# Patient Record
Sex: Female | Born: 1950
Health system: Southern US, Community
[De-identification: ages and names within clinical notes are randomized; demographics above are authoritative.]

## PROBLEM LIST (undated history)

## (undated) DIAGNOSIS — I6529 Occlusion and stenosis of unspecified carotid artery: Secondary | ICD-10-CM

## (undated) DIAGNOSIS — M810 Age-related osteoporosis without current pathological fracture: Secondary | ICD-10-CM

## (undated) DIAGNOSIS — F17200 Nicotine dependence, unspecified, uncomplicated: Secondary | ICD-10-CM

## (undated) DIAGNOSIS — I10 Essential (primary) hypertension: Secondary | ICD-10-CM

## (undated) DIAGNOSIS — R079 Chest pain, unspecified: Secondary | ICD-10-CM

## (undated) DIAGNOSIS — N83209 Unspecified ovarian cyst, unspecified side: Secondary | ICD-10-CM

## (undated) DIAGNOSIS — E890 Postprocedural hypothyroidism: Secondary | ICD-10-CM

## (undated) DIAGNOSIS — K769 Liver disease, unspecified: Secondary | ICD-10-CM

## (undated) DIAGNOSIS — E7800 Pure hypercholesterolemia, unspecified: Secondary | ICD-10-CM

## (undated) DIAGNOSIS — Z86010 Personal history of colonic polyps: Secondary | ICD-10-CM

## (undated) DIAGNOSIS — E119 Type 2 diabetes mellitus without complications: Secondary | ICD-10-CM

## (undated) DIAGNOSIS — E0590 Thyrotoxicosis, unspecified without thyrotoxic crisis or storm: Secondary | ICD-10-CM

## (undated) HISTORY — DX: Postprocedural hypothyroidism: E89.0

## (undated) HISTORY — DX: Occlusion and stenosis of unspecified carotid artery: I65.29

## (undated) HISTORY — DX: Essential (primary) hypertension: I10

## (undated) HISTORY — DX: Personal history of colonic polyps: Z86.010

## (undated) HISTORY — DX: Thyrotoxicosis, unspecified without thyrotoxic crisis or storm: E05.90

## (undated) HISTORY — DX: Type 2 diabetes mellitus without complications: E11.9

## (undated) HISTORY — DX: Pure hypercholesterolemia, unspecified: E78.00

## (undated) HISTORY — DX: Age-related osteoporosis without current pathological fracture: M81.0

## (undated) HISTORY — DX: Unspecified ovarian cyst, unspecified side: N83.209

## (undated) HISTORY — DX: Liver disease, unspecified: K76.9

## (undated) HISTORY — DX: Nicotine dependence, unspecified, uncomplicated: F17.200

## (undated) HISTORY — DX: Chest pain, unspecified: R07.9

---

## 1982-07-31 HISTORY — PX: ESOPHAGOGASTRODUODENOSCOPY: SHX1529

## 1987-05-11 HISTORY — PX: OTHER SURGICAL HISTORY: SHX169

## 1999-01-21 ENCOUNTER — Other Ambulatory Visit: Admission: RE | Admit: 1999-01-21 | Discharge: 1999-01-21 | Payer: Self-pay | Admitting: Obstetrics and Gynecology

## 2000-01-26 ENCOUNTER — Other Ambulatory Visit: Admission: RE | Admit: 2000-01-26 | Discharge: 2000-01-26 | Payer: Self-pay | Admitting: Obstetrics and Gynecology

## 2001-12-19 ENCOUNTER — Other Ambulatory Visit: Admission: RE | Admit: 2001-12-19 | Discharge: 2001-12-19 | Payer: Self-pay | Admitting: Obstetrics and Gynecology

## 2001-12-22 ENCOUNTER — Ambulatory Visit (HOSPITAL_COMMUNITY): Admission: RE | Admit: 2001-12-22 | Discharge: 2001-12-22 | Payer: Self-pay | Admitting: Obstetrics and Gynecology

## 2001-12-22 ENCOUNTER — Encounter: Payer: Self-pay | Admitting: Obstetrics and Gynecology

## 2002-01-16 ENCOUNTER — Encounter: Payer: Self-pay | Admitting: Endocrinology

## 2002-01-16 ENCOUNTER — Encounter: Admission: RE | Admit: 2002-01-16 | Discharge: 2002-01-16 | Payer: Self-pay | Admitting: Endocrinology

## 2002-05-10 DIAGNOSIS — E0590 Thyrotoxicosis, unspecified without thyrotoxic crisis or storm: Secondary | ICD-10-CM

## 2002-05-10 HISTORY — DX: Thyrotoxicosis, unspecified without thyrotoxic crisis or storm: E05.90

## 2002-06-26 HISTORY — PX: OTHER SURGICAL HISTORY: SHX169

## 2003-01-08 ENCOUNTER — Other Ambulatory Visit: Admission: RE | Admit: 2003-01-08 | Discharge: 2003-01-08 | Payer: Self-pay | Admitting: Obstetrics and Gynecology

## 2003-03-04 ENCOUNTER — Encounter (INDEPENDENT_AMBULATORY_CARE_PROVIDER_SITE_OTHER): Payer: Self-pay | Admitting: *Deleted

## 2003-03-04 ENCOUNTER — Ambulatory Visit (HOSPITAL_COMMUNITY): Admission: RE | Admit: 2003-03-04 | Discharge: 2003-03-04 | Payer: Self-pay | Admitting: Gastroenterology

## 2003-12-04 HISTORY — PX: OTHER SURGICAL HISTORY: SHX169

## 2004-01-08 ENCOUNTER — Other Ambulatory Visit: Admission: RE | Admit: 2004-01-08 | Discharge: 2004-01-08 | Payer: Self-pay | Admitting: Obstetrics and Gynecology

## 2004-04-10 ENCOUNTER — Ambulatory Visit: Payer: Self-pay | Admitting: Endocrinology

## 2005-01-15 ENCOUNTER — Ambulatory Visit: Payer: Self-pay | Admitting: Endocrinology

## 2005-01-21 ENCOUNTER — Ambulatory Visit: Payer: Self-pay | Admitting: Endocrinology

## 2005-03-03 ENCOUNTER — Other Ambulatory Visit: Admission: RE | Admit: 2005-03-03 | Discharge: 2005-03-03 | Payer: Self-pay | Admitting: Obstetrics and Gynecology

## 2005-07-12 ENCOUNTER — Ambulatory Visit: Payer: Self-pay | Admitting: Endocrinology

## 2006-02-24 ENCOUNTER — Ambulatory Visit: Payer: Self-pay | Admitting: Endocrinology

## 2006-02-24 LAB — CONVERTED CEMR LAB
ALT: 32 units/L (ref 0–40)
Albumin: 3.9 g/dL (ref 3.5–5.2)
Basophils Absolute: 0.1 10*3/uL (ref 0.0–0.1)
Chloride: 106 meq/L (ref 96–112)
Cholesterol: 111 mg/dL (ref 0–200)
Creatinine, Ser: 1 mg/dL (ref 0.4–1.2)
GFR calc non Af Amer: 62 mL/min
Glomerular Filtration Rate, Af Am: 75 mL/min/{1.73_m2}
Glucose, Bld: 139 mg/dL — ABNORMAL HIGH (ref 70–99)
HDL: 49.5 mg/dL (ref >39.0)
Hemoglobin, Urine: NEGATIVE
Leukocytes, UA: NEGATIVE
MCHC: 32.9 g/dL (ref 30.0–36.0)
MCV: 90.9 fL (ref 78.0–100.0)
Microalb Creat Ratio: 5 mg/g (ref 0.0–30.0)
Microalb, Ur: 1 mg/dL (ref 0.0–1.9)
Monocytes Absolute: 0.3 10*3/uL (ref 0.2–0.7)
Neutro Abs: 2.5 10*3/uL (ref 1.4–7.7)
Platelets: 193 10*3/uL (ref 150–400)
RBC: 5.24 M/uL — ABNORMAL HIGH (ref 3.87–5.11)
Sodium: 143 meq/L (ref 135–145)
Total Bilirubin: 0.8 mg/dL (ref 0.3–1.2)
Triglyceride fasting, serum: 85 mg/dL (ref 0–149)
Urobilinogen, UA: 0.2 (ref 0.0–1.0)
VLDL: 17 mg/dL (ref 0–40)
WBC: 4.3 10*3/uL — ABNORMAL LOW (ref 4.5–10.5)
pH: 6 (ref 5.0–8.0)

## 2006-03-02 ENCOUNTER — Ambulatory Visit: Payer: Self-pay | Admitting: Endocrinology

## 2006-03-07 ENCOUNTER — Other Ambulatory Visit: Admission: RE | Admit: 2006-03-07 | Discharge: 2006-03-07 | Payer: Self-pay | Admitting: Obstetrics and Gynecology

## 2006-09-01 ENCOUNTER — Ambulatory Visit: Payer: Self-pay | Admitting: Endocrinology

## 2006-09-01 LAB — CONVERTED CEMR LAB
Basophils Absolute: 0 10*3/uL (ref 0.0–0.1)
Hgb A1c MFr Bld: 7 % — ABNORMAL HIGH (ref 4.6–6.0)
MCHC: 33.2 g/dL (ref 30.0–36.0)
Monocytes Absolute: 0.4 10*3/uL (ref 0.2–0.7)
Monocytes Relative: 7.9 % (ref 3.0–11.0)
Platelets: 197 10*3/uL (ref 150–400)
RBC: 5.47 M/uL — ABNORMAL HIGH (ref 3.87–5.11)
RDW: 12.7 % (ref 11.5–14.6)

## 2006-09-06 HISTORY — PX: OTHER SURGICAL HISTORY: SHX169

## 2006-09-07 ENCOUNTER — Ambulatory Visit: Payer: Self-pay

## 2006-10-10 ENCOUNTER — Ambulatory Visit: Payer: Self-pay | Admitting: Internal Medicine

## 2006-12-24 ENCOUNTER — Encounter: Payer: Self-pay | Admitting: Endocrinology

## 2006-12-24 DIAGNOSIS — M810 Age-related osteoporosis without current pathological fracture: Secondary | ICD-10-CM

## 2006-12-24 DIAGNOSIS — E119 Type 2 diabetes mellitus without complications: Secondary | ICD-10-CM

## 2006-12-24 DIAGNOSIS — Z860100 Personal history of colon polyps, unspecified: Secondary | ICD-10-CM

## 2006-12-24 HISTORY — DX: Age-related osteoporosis without current pathological fracture: M81.0

## 2006-12-24 HISTORY — DX: Personal history of colonic polyps: Z86.010

## 2006-12-24 HISTORY — DX: Personal history of colon polyps, unspecified: Z86.0100

## 2006-12-24 HISTORY — DX: Type 2 diabetes mellitus without complications: E11.9

## 2007-03-01 ENCOUNTER — Ambulatory Visit: Payer: Self-pay | Admitting: Endocrinology

## 2007-03-01 LAB — CONVERTED CEMR LAB
BUN: 10 mg/dL (ref 6–23)
Basophils Relative: 3.4 % — ABNORMAL HIGH (ref 0.0–1.0)
Bilirubin Urine: NEGATIVE
Bilirubin, Direct: 0.2 mg/dL (ref 0.0–0.3)
CO2: 31 meq/L (ref 19–32)
Creatinine,U: 217.6 mg/dL
Eosinophils Relative: 2.5 % (ref 0.0–5.0)
GFR calc Af Amer: 96 mL/min
Glucose, Bld: 151 mg/dL — ABNORMAL HIGH (ref 70–99)
HDL: 48.3 mg/dL (ref >39.0)
Hemoglobin: 15.2 g/dL — ABNORMAL HIGH (ref 12.0–15.0)
Leukocytes, UA: NEGATIVE
Lymphocytes Relative: 38.1 % (ref 12.0–46.0)
Microalb Creat Ratio: 15.2 mg/g (ref 0.0–30.0)
Microalb, Ur: 3.3 mg/dL — ABNORMAL HIGH (ref 0.0–1.9)
Monocytes Absolute: 0.3 10*3/uL (ref 0.2–0.7)
Monocytes Relative: 7 % (ref 3.0–11.0)
Neutro Abs: 2.3 10*3/uL (ref 1.4–7.7)
Neutrophils Relative %: 49 % (ref 43.0–77.0)
Potassium: 3.8 meq/L (ref 3.5–5.1)
Sodium: 141 meq/L (ref 135–145)
Specific Gravity, Urine: 1.025 (ref 1.000–1.03)
Total Protein, Urine: NEGATIVE mg/dL
Total Protein: 6.2 g/dL (ref 6.0–8.3)
Triglycerides: 82 mg/dL (ref 0–149)

## 2007-03-07 ENCOUNTER — Ambulatory Visit: Payer: Self-pay | Admitting: Endocrinology

## 2007-10-05 ENCOUNTER — Encounter: Payer: Self-pay | Admitting: Endocrinology

## 2007-11-09 ENCOUNTER — Telehealth (INDEPENDENT_AMBULATORY_CARE_PROVIDER_SITE_OTHER): Payer: Self-pay | Admitting: *Deleted

## 2008-02-02 ENCOUNTER — Encounter: Payer: Self-pay | Admitting: Endocrinology

## 2008-03-05 ENCOUNTER — Ambulatory Visit: Payer: Self-pay | Admitting: Endocrinology

## 2008-03-06 LAB — CONVERTED CEMR LAB
ALT: 43 units/L — ABNORMAL HIGH (ref 0–35)
AST: 34 units/L (ref 0–37)
Alkaline Phosphatase: 61 units/L (ref 39–117)
Basophils Absolute: 0 10*3/uL (ref 0.0–0.1)
Basophils Relative: 0.6 % (ref 0.0–3.0)
Bilirubin, Direct: 0.3 mg/dL (ref 0.0–0.3)
CO2: 32 meq/L (ref 19–32)
Chloride: 104 meq/L (ref 96–112)
Creatinine, Ser: 0.8 mg/dL (ref 0.4–1.2)
GFR calc non Af Amer: 79 mL/min
Hgb A1c MFr Bld: 6.5 % — ABNORMAL HIGH (ref 4.6–6.0)
LDL Cholesterol: 36 mg/dL (ref 0–99)
Leukocytes, UA: NEGATIVE
Lymphocytes Relative: 26.1 % (ref 12.0–46.0)
MCHC: 34.3 g/dL (ref 30.0–36.0)
Neutrophils Relative %: 67 % (ref 43.0–77.0)
Platelets: 196 10*3/uL (ref 150–400)
Potassium: 4.1 meq/L (ref 3.5–5.1)
RBC: 5.17 M/uL — ABNORMAL HIGH (ref 3.87–5.11)
Specific Gravity, Urine: 1.025 (ref 1.000–1.03)
Total Bilirubin: 0.8 mg/dL (ref 0.3–1.2)
Total CHOL/HDL Ratio: 2
Urobilinogen, UA: 0.2 (ref 0.0–1.0)
WBC: 7.3 10*3/uL (ref 4.5–10.5)

## 2008-03-07 ENCOUNTER — Ambulatory Visit: Payer: Self-pay | Admitting: Endocrinology

## 2008-03-07 DIAGNOSIS — I6529 Occlusion and stenosis of unspecified carotid artery: Secondary | ICD-10-CM | POA: Insufficient documentation

## 2008-03-07 DIAGNOSIS — F17200 Nicotine dependence, unspecified, uncomplicated: Secondary | ICD-10-CM | POA: Insufficient documentation

## 2008-03-07 DIAGNOSIS — N83209 Unspecified ovarian cyst, unspecified side: Secondary | ICD-10-CM | POA: Insufficient documentation

## 2008-03-07 DIAGNOSIS — E890 Postprocedural hypothyroidism: Secondary | ICD-10-CM

## 2008-03-07 HISTORY — DX: Postprocedural hypothyroidism: E89.0

## 2008-03-07 HISTORY — DX: Unspecified ovarian cyst, unspecified side: N83.209

## 2008-03-07 HISTORY — DX: Occlusion and stenosis of unspecified carotid artery: I65.29

## 2008-03-07 HISTORY — DX: Nicotine dependence, unspecified, uncomplicated: F17.200

## 2008-03-19 ENCOUNTER — Encounter: Payer: Self-pay | Admitting: Endocrinology

## 2008-04-01 ENCOUNTER — Ambulatory Visit (HOSPITAL_COMMUNITY): Admission: RE | Admit: 2008-04-01 | Discharge: 2008-04-01 | Payer: Self-pay | Admitting: Gastroenterology

## 2008-04-08 ENCOUNTER — Encounter: Payer: Self-pay | Admitting: Endocrinology

## 2008-06-18 ENCOUNTER — Encounter: Payer: Self-pay | Admitting: Endocrinology

## 2008-07-28 ENCOUNTER — Encounter: Payer: Self-pay | Admitting: Endocrinology

## 2008-09-03 ENCOUNTER — Ambulatory Visit: Payer: Self-pay | Admitting: Endocrinology

## 2008-09-03 DIAGNOSIS — E7800 Pure hypercholesterolemia, unspecified: Secondary | ICD-10-CM | POA: Insufficient documentation

## 2008-09-03 DIAGNOSIS — K7581 Nonalcoholic steatohepatitis (NASH): Secondary | ICD-10-CM | POA: Insufficient documentation

## 2008-09-03 DIAGNOSIS — I10 Essential (primary) hypertension: Secondary | ICD-10-CM | POA: Insufficient documentation

## 2008-09-03 DIAGNOSIS — K769 Liver disease, unspecified: Secondary | ICD-10-CM

## 2008-09-03 HISTORY — DX: Liver disease, unspecified: K76.9

## 2008-09-03 HISTORY — DX: Pure hypercholesterolemia, unspecified: E78.00

## 2008-09-03 HISTORY — DX: Essential (primary) hypertension: I10

## 2008-09-03 LAB — CONVERTED CEMR LAB
ALT: 36 units/L — ABNORMAL HIGH (ref 0–35)
AST: 33 units/L (ref 0–37)
Alkaline Phosphatase: 52 units/L (ref 39–117)
Bilirubin, Direct: 0.2 mg/dL (ref 0.0–0.3)
Hgb A1c MFr Bld: 7 % — ABNORMAL HIGH (ref 4.6–6.5)
Total Bilirubin: 0.9 mg/dL (ref 0.3–1.2)

## 2008-09-10 ENCOUNTER — Encounter: Payer: Self-pay | Admitting: Endocrinology

## 2008-09-10 ENCOUNTER — Ambulatory Visit: Payer: Self-pay

## 2008-09-11 ENCOUNTER — Emergency Department (HOSPITAL_COMMUNITY): Admission: EM | Admit: 2008-09-11 | Discharge: 2008-09-11 | Payer: Self-pay | Admitting: Emergency Medicine

## 2008-10-03 ENCOUNTER — Encounter: Payer: Self-pay | Admitting: Endocrinology

## 2008-11-12 ENCOUNTER — Ambulatory Visit: Payer: Self-pay | Admitting: Family Medicine

## 2008-11-12 ENCOUNTER — Encounter: Payer: Self-pay | Admitting: Endocrinology

## 2009-02-05 ENCOUNTER — Encounter: Payer: Self-pay | Admitting: Endocrinology

## 2009-03-05 ENCOUNTER — Ambulatory Visit: Payer: Self-pay | Admitting: Endocrinology

## 2009-03-05 LAB — CONVERTED CEMR LAB
ALT: 73 units/L — ABNORMAL HIGH (ref 0–35)
AST: 52 units/L — ABNORMAL HIGH (ref 0–37)
Albumin: 3.7 g/dL (ref 3.5–5.2)
Alkaline Phosphatase: 62 units/L (ref 39–117)
BUN: 9 mg/dL (ref 6–23)
Basophils Absolute: 0 10*3/uL (ref 0.0–0.1)
Basophils Relative: 0.2 % (ref 0.0–3.0)
Bilirubin Urine: NEGATIVE
Bilirubin, Direct: 0.2 mg/dL (ref 0.0–0.3)
CO2: 32 meq/L (ref 19–32)
Calcium: 9.2 mg/dL (ref 8.4–10.5)
Chloride: 105 meq/L (ref 96–112)
Cholesterol: 90 mg/dL (ref 0–200)
Creatinine, Ser: 0.9 mg/dL (ref 0.4–1.2)
Creatinine,U: 189.5 mg/dL
Eosinophils Absolute: 0.1 10*3/uL (ref 0.0–0.7)
Eosinophils Relative: 2.4 % (ref 0.0–5.0)
GFR calc non Af Amer: 83 mL/min (ref >60)
Glucose, Bld: 143 mg/dL — ABNORMAL HIGH (ref 70–99)
HCT: 45.4 % (ref 36.0–46.0)
HDL: 42.1 mg/dL (ref >39.00)
Hemoglobin: 15 g/dL (ref 12.0–15.0)
Hgb A1c MFr Bld: 6.9 % — ABNORMAL HIGH (ref 4.6–6.5)
LDL Cholesterol: 28 mg/dL (ref 0–99)
Leukocytes, UA: NEGATIVE
Lymphocytes Relative: 31.6 % (ref 12.0–46.0)
Lymphs Abs: 1.7 10*3/uL (ref 0.7–4.0)
MCHC: 33.1 g/dL (ref 30.0–36.0)
MCV: 92.7 fL (ref 78.0–100.0)
Microalb Creat Ratio: 19 mg/g (ref 0.0–30.0)
Microalb, Ur: 3.6 mg/dL — ABNORMAL HIGH (ref 0.0–1.9)
Monocytes Absolute: 0.4 10*3/uL (ref 0.1–1.0)
Monocytes Relative: 7.5 % (ref 3.0–12.0)
Neutro Abs: 3.2 10*3/uL (ref 1.4–7.7)
Neutrophils Relative %: 58.3 % (ref 43.0–77.0)
Nitrite: NEGATIVE
Platelets: 186 10*3/uL (ref 150.0–400.0)
Potassium: 4.5 meq/L (ref 3.5–5.1)
RBC: 4.9 M/uL (ref 3.87–5.11)
RDW: 13.3 % (ref 11.5–14.6)
Sodium: 144 meq/L (ref 135–145)
Specific Gravity, Urine: 1.025 (ref 1.000–1.030)
TSH: 8.87 microintl units/mL — ABNORMAL HIGH (ref 0.35–5.50)
Total Bilirubin: 0.7 mg/dL (ref 0.3–1.2)
Total CHOL/HDL Ratio: 2
Total Protein, Urine: 30 mg/dL
Total Protein: 6.3 g/dL (ref 6.0–8.3)
Triglycerides: 100 mg/dL (ref 0.0–149.0)
Urine Glucose: NEGATIVE mg/dL
Urobilinogen, UA: 1 (ref 0.0–1.0)
VLDL: 20 mg/dL (ref 0.0–40.0)
WBC: 5.4 10*3/uL (ref 4.5–10.5)
pH: 6 (ref 5.0–8.0)

## 2009-03-10 ENCOUNTER — Ambulatory Visit: Payer: Self-pay | Admitting: Endocrinology

## 2009-03-10 DIAGNOSIS — R079 Chest pain, unspecified: Secondary | ICD-10-CM

## 2009-03-10 DIAGNOSIS — R072 Precordial pain: Secondary | ICD-10-CM | POA: Insufficient documentation

## 2009-03-10 HISTORY — DX: Chest pain, unspecified: R07.9

## 2009-03-18 ENCOUNTER — Telehealth (INDEPENDENT_AMBULATORY_CARE_PROVIDER_SITE_OTHER): Payer: Self-pay | Admitting: *Deleted

## 2009-03-19 ENCOUNTER — Ambulatory Visit: Payer: Self-pay

## 2009-03-19 ENCOUNTER — Ambulatory Visit: Payer: Self-pay | Admitting: Internal Medicine

## 2009-03-19 ENCOUNTER — Encounter (HOSPITAL_COMMUNITY): Admission: RE | Admit: 2009-03-19 | Discharge: 2009-05-08 | Payer: Self-pay | Admitting: Endocrinology

## 2009-03-31 ENCOUNTER — Telehealth (INDEPENDENT_AMBULATORY_CARE_PROVIDER_SITE_OTHER): Payer: Self-pay | Admitting: *Deleted

## 2009-08-21 ENCOUNTER — Encounter: Payer: Self-pay | Admitting: Endocrinology

## 2009-08-22 ENCOUNTER — Telehealth (INDEPENDENT_AMBULATORY_CARE_PROVIDER_SITE_OTHER): Payer: Self-pay | Admitting: *Deleted

## 2009-09-09 ENCOUNTER — Ambulatory Visit: Payer: Self-pay | Admitting: Endocrinology

## 2009-09-09 LAB — CONVERTED CEMR LAB
AST: 21 units/L (ref 0–37)
Alkaline Phosphatase: 69 units/L (ref 39–117)
Bilirubin, Direct: 0.2 mg/dL (ref 0.0–0.3)
Hgb A1c MFr Bld: 6.7 % — ABNORMAL HIGH (ref 4.6–6.5)
Total Bilirubin: 1 mg/dL (ref 0.3–1.2)

## 2009-09-18 ENCOUNTER — Telehealth: Payer: Self-pay | Admitting: Endocrinology

## 2009-10-14 ENCOUNTER — Encounter: Payer: Self-pay | Admitting: Endocrinology

## 2010-01-27 ENCOUNTER — Encounter: Payer: Self-pay | Admitting: Endocrinology

## 2010-03-03 ENCOUNTER — Ambulatory Visit: Payer: Self-pay | Admitting: Endocrinology

## 2010-03-03 LAB — CONVERTED CEMR LAB
ALT: 21 units/L (ref 0–35)
AST: 23 units/L (ref 0–37)
BUN: 13 mg/dL (ref 6–23)
Bilirubin Urine: NEGATIVE
Bilirubin, Direct: 0.1 mg/dL (ref 0.0–0.3)
Cholesterol: 145 mg/dL (ref 0–200)
Eosinophils Relative: 2 % (ref 0.0–5.0)
GFR calc non Af Amer: 82.71 mL/min (ref >60)
HCT: 45 % (ref 36.0–46.0)
Hgb A1c MFr Bld: 6.8 % — ABNORMAL HIGH (ref 4.6–6.5)
Ketones, ur: NEGATIVE mg/dL
LDL Cholesterol: 72 mg/dL (ref 0–99)
Lymphs Abs: 2.3 10*3/uL (ref 0.7–4.0)
Microalb Creat Ratio: 1.2 mg/g (ref 0.0–30.0)
Monocytes Relative: 8.1 % (ref 3.0–12.0)
Platelets: 198 10*3/uL (ref 150.0–400.0)
Potassium: 4.1 meq/L (ref 3.5–5.1)
RBC: 5.03 M/uL (ref 3.87–5.11)
Sodium: 140 meq/L (ref 135–145)
Total Bilirubin: 0.5 mg/dL (ref 0.3–1.2)
Total CHOL/HDL Ratio: 3
Total Protein, Urine: NEGATIVE mg/dL
VLDL: 18.4 mg/dL (ref 0.0–40.0)
WBC: 5.6 10*3/uL (ref 4.5–10.5)
pH: 5.5 (ref 5.0–8.0)

## 2010-03-11 ENCOUNTER — Ambulatory Visit: Payer: Self-pay | Admitting: Endocrinology

## 2010-03-11 ENCOUNTER — Encounter: Payer: Self-pay | Admitting: Endocrinology

## 2010-03-11 DIAGNOSIS — M79609 Pain in unspecified limb: Secondary | ICD-10-CM | POA: Insufficient documentation

## 2010-06-09 NOTE — Progress Notes (Signed)
Summary: Pharmacy change  Phone Note Call from Patient Call back at Home Phone [phone]   Caller: Patient Summary of Call: pharmacy change Initial call taken by: Margaret Pyle, CMA,  Sep 18, 2009 2:28 PM    Prescriptions: LEVOTHYROXINE SODIUM 100 MCG TABS (LEVOTHYROXINE SODIUM) 1 once daily  #30 x 11   Entered by:   Margaret Pyle, CMA   Authorized by:   Minus Breeding MD   Signed by:   Margaret Pyle, CMA on 09/18/2009   Method used:   Electronically to        [facility] [phone]* (retail)       [address]       [place], [place]  [address]       Ph: [phone]       Fax: [phone]   RxID:   [id number]

## 2010-06-09 NOTE — Letter (Signed)
Summary: [facility]   Imported By: Lester Allen 03/03/2010 16:10:96  _____________________________________________________________________  External Attachment:    Type:   Image     Comment:   External Document

## 2010-06-09 NOTE — Assessment & Plan Note (Signed)
Summary: Cpx,#/cd   Vital Signs:  Patient profile:   60 year old female Height:      61 inches (154.94 cm) Weight:      103 pounds (46.82 kg) BMI:     19.53 O2 Sat:      97 % on Room air Temp:     98.4 degrees F (36.89 degrees C) oral Pulse rate:   92 / minute BP sitting:   124 / 72  (left arm) Cuff size:   regular  Vitals Entered By: Brenton Grills CMA Duncan Dull) (March 11, 2010 10:10 AM)  O2 Flow:  Room air CC: Physical/aj Is Patient Diabetic? Yes   CC:  Physical/aj.  History of Present Illness: here for regular wellness examination.  she's feeling pretty well in general, and does not drink etoh.  she did not like the efects of the wellbutrin.     Current Medications (verified): 1)  Lisinopril-Hydrochlorothiazide 10-12.5 Mg  Tabs (Lisinopril-Hydrochlorothiazide) .... Take 1/2 By Mouth Once Daily 2)  Glucophage 1000 Mg  Tabs (Metformin Hcl) .... Take 2 By Mouth Qd 3)  Adult Aspirin Low Strength 81 Mg  Tbdp (Aspirin) .... Take 1 By Mouth Qd 4)  Crestor 40 Mg  Tabs (Rosuvastatin Calcium) .... Take 1 By Mouth Qd 5)  Freestyle Test   Strp (Glucose Blood) .... Use As Directed 6)  Levothyroxine Sodium 100 Mcg Tabs (Levothyroxine Sodium) .Marland Kitchen.. 1 Once Daily  Allergies (verified): No Known Drug Allergies  Family History: Reviewed history from 03/07/2008 and no changes required. no cancer  Social History: Reviewed history from 03/07/2008 and no changes required. married does not work outside the home  Review of Systems  The patient denies fever, weight loss, weight gain, vision loss, decreased hearing, chest pain, syncope, dyspnea on exertion, prolonged cough, headaches, abdominal pain, melena, hematochezia, severe indigestion/heartburn, hematuria, suspicious skin lesions, and depression.    Physical Exam  General:  normal appearance.   Head:  head: no deformity eyes: no periorbital swelling, no proptosis external nose and ears are normal mouth: no lesion seen Neck:   Supple without thyroid enlargement or tenderness.  Breasts:  sees gyn  Lungs:  Clear to auscultation bilaterally. Normal respiratory effort.  Heart:  Regular rate and rhythm without murmurs or gallops noted. Normal S1,S2.   Abdomen:  abdomen is soft, nontender.  no hepatosplenomegaly.   not distended.  no hernia  Rectal:  sees gyn  Genitalia:  sees gyn  Msk:  muscle bulk and strength are grossly normal.  no obvious joint swelling.  gait is normal and steady  Pulses:  dorsalis pedis intact bilat.  no carotid bruit  Extremities:  no deformity.  no ulcer on the feet.  feet are of normal color and temp.  no edema  Neurologic:  cn 2-12 grossly intact.   readily moves all 4's.   sensation is intact to touch on the feet  Skin:  normal texture and temp.  no rash.  not diaphoretic  Cervical Nodes:  No significant adenopathy.  Psych:  Alert and cooperative; normal mood and affect; normal attention span and concentration.   Additional Exam:  SEPARATE EVALUATION FOLLOWS--EACH PROBLEM HERE IS NEW, NOT RESPONDING TO TREATMENT, OR POSES SIGNIFICANT RISK TO THE PATIENT'S HEALTH: HISTORY OF THE PRESENT ILLNESS: pt states 8 mos of slight pain at the pip of the right middle finger.  not in the context of an injury. PAST MEDICAL HISTORY reviewed and up to date today REVIEW OF SYSTEMS: denies numbness PHYSICAL EXAMINATION:  at the pip of the right middle finger, there is moderate swelling, but no tenderness/erythema/warmth LAB/XRAY RESULTS: x ray results noted IMPRESSION: finger pain, uncertain etiology.  new problem PLAN: (update: i left message on phone-tree:  call if you want to see rheumatol   Impression & Recommendations:  Problem # 1:  ROUTINE GENERAL MEDICAL EXAM@HEALTH  CARE FACL (ICD-V70.0)  Problem # 2:  SMOKER (ICD-305.1) pt declines chantix  Other Orders: EKG w/ Interpretation (93000) Spirometry w/Graph (94010) T-Hand Right 3 views (73130TC) Est. Patient Level III  (16109) Est. Patient 40-64 years (60454)  Patient Instructions: 1)  please consider these measures for your health:  minimize alcohol.  do not use tobacco products.  have a colonoscopy at least every 10 years from age 77.  keep firearms safely stored.  always use seat belts.  have working smoke alarms in your home.  see an eye doctor and dentist regularly.  never drive under the influence of alcohol or drugs (including prescription drugs). 2)  please let me know what your wishes would be, if artificial life support measures should become necessary.  it is critically important to prevent falling down (keep floor areas well-lit, dry, and free of loose objects). 3)  try the nicotine patch to help you quit smoking.   4)  (update:  we discussed code status.  pt requests full code, but would not want to be started or maintained on artificial life-support measures if there was not a reasonable chance of recovery)   Orders Added: 1)  EKG w/ Interpretation [93000] 2)  Spirometry w/Graph [94010] 3)  T-Hand Right 3 views [73130TC] 4)  Est. Patient Level III [09811] 5)  Est. Patient 40-64 years [99396]     Preventive Care Screening  Mammogram:    Date:  10/14/2009    Results:  normal      gyn is dr Stefano Gaul

## 2010-06-09 NOTE — Assessment & Plan Note (Signed)
Summary: PER PT 6 MTH FU STC   Vital Signs:  Patient profile:   60 year old female Height:      61 inches Weight:      103.25 pounds BMI:     19.58 O2 Sat:      97 % on Room air Temp:     97.9 degrees F oral Pulse rate:   93 / minute BP sitting:   112 / 80  (left arm) Cuff size:   regular  Vitals Entered ByZella Ball Ewing (Sep 09, 2009 9:21 AM)  O2 Flow:  Room air CC: 6 month followup/Re   CC:  6 month followup/Re.  History of Present Illness: pt says she is now taking synthroid 125 micrograms/day.  pt states she feels well in general. no cbg record, but states cbg's are well-controlled.  Current Medications (verified): 1)  Lisinopril-Hydrochlorothiazide 10-12.5 Mg  Tabs (Lisinopril-Hydrochlorothiazide) .... Take 1/2 By Mouth Once Daily 2)  Glucophage 1000 Mg  Tabs (Metformin Hcl) .... Take 2 By Mouth Qd 3)  Adult Aspirin Low Strength 81 Mg  Tbdp (Aspirin) .... Take 1 By Mouth Qd 4)  Crestor 40 Mg  Tabs (Rosuvastatin Calcium) .... Take 1 By Mouth Qd 5)  Freestyle Test   Strp (Glucose Blood) .... Use As Directed 6)  Levothyroxine Sodium 100 Mcg Tabs (Levothyroxine Sodium) .Marland Kitchen.. 1 Qd  Allergies (verified): No Known Drug Allergies  Past History:  Past Medical History: Last updated: 12/24/2006 Colonic polyps, hx of Diabetes mellitus, type II (2004) Hyperthyroidism (2004) Hypothyroidism due to I-131 Osteoporosis Smoker (R) Ovarian Cyst Mild Carotid Stenosis (09/2003)  Review of Systems  The patient denies dyspnea on exertion.    Physical Exam  General:  normal appearance.   Neck:  Supple without thyroid enlargement or tenderness.   Additional Exam:  FastTSH              [L]  0.11 uIU/mL                 0.35-5.50 Hemoglobin A1C       [H]  6.7 %       Impression & Recommendations:  Problem # 1:  HYPOTHYROIDISM, POST-RADIATION (ICD-244.1) overreplaced  Medications Added to Medication List This Visit: 1)  Levothyroxine Sodium 100 Mcg Tabs (Levothyroxine  sodium) .Marland Kitchen.. 1 once daily  Other Orders: TLB-TSH (Thyroid Stimulating Hormone) (84443-TSH) TLB-A1C / Hgb A1C (Glycohemoglobin) (83036-A1C) TLB-Hepatic/Liver Function Pnl (80076-HEPATIC) Est. Patient Level III (81191)  Patient Instructions: 1)  tests are being ordered for you today.  a few days after the test(s), please call [phone] to hear your test results. 2)  we'll decide the amount of thyroid medication based on today's result (now 125 micrograms/day). 3)  (update: i left message on phone-tree:  reduce synthroid to 100 micrograms/day.  same metformin). Prescriptions: LEVOTHYROXINE SODIUM 100 MCG TABS (LEVOTHYROXINE SODIUM) 1 once daily  #30 x 11   Entered and Authorized by:   Minus Breeding MD   Signed by:   Minus Breeding MD on 09/09/2009   Method used:   Electronically to        [facility] (retail)       [address]       [place], [place]  [address]       Ph: [phone]       Fax: [phone]   RxID:   [id number]

## 2010-06-09 NOTE — Medication Information (Signed)
Summary: [facility]  [facility]   Imported By: Sherian Rein 08/26/2009 08:35:39  _____________________________________________________________________  External Attachment:    Type:   Image     Comment:   External Document

## 2010-06-09 NOTE — Progress Notes (Signed)
Summary: [facility]  Phone Note Outgoing Call   Summary of Call: Faxed completed paperwork to [facility] Initial call taken by: Josph Macho RMA,  August 22, 2009 7:43 AM

## 2010-08-18 LAB — GLUCOSE, CAPILLARY: Glucose-Capillary: 125 mg/dL — ABNORMAL HIGH (ref 70–99)

## 2010-09-15 ENCOUNTER — Other Ambulatory Visit: Payer: Self-pay | Admitting: Endocrinology

## 2010-09-25 NOTE — Op Note (Signed)
   NAME:  Vicki Price, Vicki Price                       ACCOUNT NO.:  [id number]   MEDICAL RECORD NO.:  [id number]                   PATIENT TYPE:  AMB   LOCATION:  ENDO                                 FACILITY:  [facility]   PHYSICIAN:  Anselmo Rod, M.D.               DATE OF BIRTH:  1950-12-10   DATE OF PROCEDURE:  03/04/2003  DATE OF DISCHARGE:                                 OPERATIVE REPORT   PROCEDURE PERFORMED:  Colonoscopy with snare polypectomy x2.   ENDOSCOPIST:  Anselmo Rod, M.D.   INSTRUMENT USED:  Olympus video colonoscope.   INDICATION FOR PROCEDURE:  A 60 year old African-American female undergoing  screening colonoscopy.  Rule out colonic polyps, masses, etc.   PREPROCEDURE PREPARATION:  Informed consent was procured from the patient.  The patient was fasted for eight hours prior to the procedure and prepped  with a bottle of magnesium citrate and a gallon of GoLYTELY the night prior  to the procedure.   PREPROCEDURE PHYSICAL:  VITAL SIGNS:  The patient had stable vital signs.  NECK:  Supple.  CHEST:  Clear to auscultation.  S1, S2 regular.  ABDOMEN:  Soft with normal bowel sounds.   DESCRIPTION OF PROCEDURE:  The patient was placed in the left lateral  decubitus position and sedated with 80 mg of Demerol and 8 mg of Versed  intravenously.  Once the patient was adequately sedate and maintained on low-  flow oxygen and continuous cardiac monitoring, the Olympus video colonoscope  was advanced from the rectum to the cecum without difficulty.  A small  sessile polyp was snared from 50 cm.  Another polyp was snared from the  cecal base.  No other masses or polyps were seen.  There was no evidence of  diverticulosis.  There was some residual stool in the colon.  Multiple  washes were done.  The appendiceal orifice and the ileocecal valve were  clearly visualized and photographed.  Retroflexion in the rectum revealed no  abnormalities except for small internal  hemorrhoids.   IMPRESSION:  1. Two polyps removed from the colon (see description above).  2. Small, nonbleeding internal hemorrhoids.    RECOMMENDATIONS:  1. Await pathology results.  2. Avoid all nonsteroidals, including aspirin, for the next two weeks.  3. Outpatient follow-up in the next two weeks for further recommendations.                                               Anselmo Rod, M.D.    JNM/MEDQ  D:  03/04/2003  T:  03/04/2003  Job:  [id number]   cc:   Janine Limbo, M.D.  [address]  [place]  [place] [address]  Fax: [phone]

## 2010-10-01 ENCOUNTER — Other Ambulatory Visit: Payer: Self-pay | Admitting: Endocrinology

## 2010-10-13 ENCOUNTER — Other Ambulatory Visit: Payer: Self-pay | Admitting: Internal Medicine

## 2010-10-14 ENCOUNTER — Other Ambulatory Visit: Payer: Self-pay | Admitting: Endocrinology

## 2010-10-14 ENCOUNTER — Encounter (INDEPENDENT_AMBULATORY_CARE_PROVIDER_SITE_OTHER): Payer: Federal, State, Local not specified - PPO | Admitting: *Deleted

## 2010-10-14 ENCOUNTER — Other Ambulatory Visit: Payer: Self-pay | Admitting: *Deleted

## 2010-10-14 DIAGNOSIS — I6529 Occlusion and stenosis of unspecified carotid artery: Secondary | ICD-10-CM

## 2010-10-19 ENCOUNTER — Encounter: Payer: Self-pay | Admitting: Endocrinology

## 2010-10-28 ENCOUNTER — Encounter: Payer: Self-pay | Admitting: Endocrinology

## 2011-01-20 ENCOUNTER — Telehealth: Payer: Self-pay | Admitting: *Deleted

## 2011-01-20 NOTE — Telephone Encounter (Signed)
Per MD, pt is due for F/U OV.  

## 2011-01-20 NOTE — Telephone Encounter (Signed)
Appointment scheduled 01/25/2011.

## 2011-01-25 ENCOUNTER — Ambulatory Visit (INDEPENDENT_AMBULATORY_CARE_PROVIDER_SITE_OTHER): Payer: Federal, State, Local not specified - PPO | Admitting: Endocrinology

## 2011-01-25 ENCOUNTER — Encounter: Payer: Self-pay | Admitting: Endocrinology

## 2011-01-25 ENCOUNTER — Other Ambulatory Visit (INDEPENDENT_AMBULATORY_CARE_PROVIDER_SITE_OTHER): Payer: Federal, State, Local not specified - PPO

## 2011-01-25 VITALS — BP 112/82 | HR 89 | Temp 98.2°F | Ht 61.0 in | Wt 106.8 lb

## 2011-01-25 DIAGNOSIS — E119 Type 2 diabetes mellitus without complications: Secondary | ICD-10-CM

## 2011-01-25 DIAGNOSIS — F17200 Nicotine dependence, unspecified, uncomplicated: Secondary | ICD-10-CM

## 2011-01-25 NOTE — Progress Notes (Signed)
  Subjective:    Patient ID: Vicki Price, female    DOB: August 05, 1950, 60 y.o.   MRN: [id number]  HPI no cbg record, but states cbg's are well-controlled. Pt wants to quit smoking, but did not tolerate wellbutrin. Past Medical History  Diagnosis Date  . HYPOTHYROIDISM, POST-RADIATION 03/07/2008  . DIABETES MELLITUS, TYPE II 12/24/2006    2004  . HYPERCHOLESTEROLEMIA 09/03/2008  . HYPERTENSION 09/03/2008  . SMOKER 03/07/2008  . CAROTID ARTERY STENOSIS, BILATERAL 03/07/2008    09/2003  . Unspecified disorder of liver 09/03/2008  . OVARIAN CYST, RIGHT 03/07/2008  . OSTEOPOROSIS 12/24/2006  . COLONIC POLYPS, HX OF 12/24/2006  . CHEST PAIN UNSPECIFIED 03/10/2009  . Hyperthyroidism 2004    Past Surgical History  Procedure Date  . Btl 1989  . I-131 therapy 06/26/2002  . Esophagogastroduodenoscopy 07/31/1982  . Gated spect wall motion stress cardiolite 12/04/2003  . Carotid duplex 09/06/2006    History   Social History  . Marital Status: Married    Spouse Name: N/A    Number of Children: N/A  . Years of Education: N/A   Occupational History  . Homemaker    Social History Main Topics  . Smoking status: Current Everyday Smoker  . Smokeless tobacco: Not on file  . Alcohol Use: Not on file  . Drug Use: Not on file  . Sexually Active: Not on file   Other Topics Concern  . Not on file   Social History Narrative   Does not work outside the home    Current Outpatient Prescriptions on File Prior to Visit  Medication Sig Dispense Refill  . aspirin 81 MG tablet Take 81 mg by mouth daily.        . CRESTOR 40 MG tablet TAKE ONE TABLET BY MOUTH EVERY DAY  30 each  5  . glucose blood (FREESTYLE LITE) test strip Use as instructed       . levothyroxine (SYNTHROID, LEVOTHROID) 100 MCG tablet TAKE ONE TABLET BY MOUTH EVERY DAY  30 tablet  5  . metFORMIN (GLUCOPHAGE) 1000 MG tablet TAKE TWO TABLETS BY MOUTH EVERY DAY WITH FOOD  60 tablet  5    No Known Allergies  Family History    Problem Relation Age of Onset  . Cancer Neg Hx    BP 112/82  Pulse 89  Temp(Src) 98.2 F (36.8 C) (Oral)  Ht 5\' 1"  (1.549 m)  Wt 106 lb 12.8 oz (48.444 kg)  BMI 20.18 kg/m2  SpO2 99%  Review of Systems Denies weight change    Objective:   Physical Exam Pulses: dorsalis pedis intact bilat.   Feet: no deformity.  no ulcer on the feet.  feet are of normal color and temp.  no edema Neuro: sensation is intact to touch on the feet  Lab Results  Component Value Date   HGBA1C 6.7* 01/25/2011      Assessment & Plan:  Dm, well-controlled Smoker.   She would like to quit.  Counseling x 5 minutes

## 2011-01-25 NOTE — Patient Instructions (Addendum)
blood tests are being requested for you today.  please call [phone] to hear your test results.  You will be prompted to enter the 9-digit "MRN" number that appears at the top left of this page, followed by #.  Then you will hear the message. Please make a regular physical appointment for December.   Smoking is very dangerous.  It causes many health problems, including heart problems, cancer, emphysema, and death.  Please read the paper i am giving you today.  You can get help by calling 1-800-QUIT-NOW.  It is known that smokers who use quit-smoking medication are more successful quitting than those who don't.  i would be happy to prescribe you medication to help you quit.

## 2011-01-26 ENCOUNTER — Other Ambulatory Visit: Payer: Self-pay | Admitting: Endocrinology

## 2011-03-08 ENCOUNTER — Encounter: Payer: Self-pay | Admitting: Endocrinology

## 2011-03-08 ENCOUNTER — Ambulatory Visit (INDEPENDENT_AMBULATORY_CARE_PROVIDER_SITE_OTHER): Payer: Federal, State, Local not specified - PPO | Admitting: Endocrinology

## 2011-03-08 ENCOUNTER — Ambulatory Visit (INDEPENDENT_AMBULATORY_CARE_PROVIDER_SITE_OTHER)
Admission: RE | Admit: 2011-03-08 | Discharge: 2011-03-08 | Disposition: A | Discharge status: Home / Self Care | Payer: Federal, State, Local not specified - PPO | Source: Ambulatory Visit

## 2011-03-08 ENCOUNTER — Other Ambulatory Visit (INDEPENDENT_AMBULATORY_CARE_PROVIDER_SITE_OTHER): Payer: Federal, State, Local not specified - PPO

## 2011-03-08 DIAGNOSIS — M810 Age-related osteoporosis without current pathological fracture: Secondary | ICD-10-CM

## 2011-03-08 DIAGNOSIS — E119 Type 2 diabetes mellitus without complications: Secondary | ICD-10-CM

## 2011-03-08 DIAGNOSIS — Z23 Encounter for immunization: Secondary | ICD-10-CM

## 2011-03-08 DIAGNOSIS — Z79899 Other long term (current) drug therapy: Secondary | ICD-10-CM

## 2011-03-08 DIAGNOSIS — E7800 Pure hypercholesterolemia, unspecified: Secondary | ICD-10-CM

## 2011-03-08 DIAGNOSIS — E890 Postprocedural hypothyroidism: Secondary | ICD-10-CM

## 2011-03-08 DIAGNOSIS — Z0000 Encounter for general adult medical examination without abnormal findings: Secondary | ICD-10-CM

## 2011-03-08 DIAGNOSIS — I10 Essential (primary) hypertension: Secondary | ICD-10-CM

## 2011-03-08 DIAGNOSIS — K769 Liver disease, unspecified: Secondary | ICD-10-CM

## 2011-03-08 LAB — URINALYSIS, ROUTINE W REFLEX MICROSCOPIC
Leukocytes, UA: NEGATIVE
Specific Gravity, Urine: 1.02 (ref 1.000–1.030)
Urobilinogen, UA: 0.2 (ref 0.0–1.0)

## 2011-03-08 LAB — CBC WITH DIFFERENTIAL/PLATELET
Basophils Absolute: 0 10*3/uL (ref 0.0–0.1)
Eosinophils Absolute: 0 10*3/uL (ref 0.0–0.7)
Hemoglobin: 15.4 g/dL — ABNORMAL HIGH (ref 12.0–15.0)
Lymphocytes Relative: 25.9 % (ref 12.0–46.0)
MCHC: 33.9 g/dL (ref 30.0–36.0)
Neutro Abs: 3.4 10*3/uL (ref 1.4–7.7)
Neutrophils Relative %: 64.9 % (ref 43.0–77.0)
RDW: 14.4 % (ref 11.5–14.6)

## 2011-03-08 LAB — BASIC METABOLIC PANEL
BUN: 11 mg/dL (ref 6–23)
Creatinine, Ser: 0.8 mg/dL (ref 0.4–1.2)
GFR: 97.22 mL/min (ref >60.00)

## 2011-03-08 LAB — PTH, INTACT AND CALCIUM

## 2011-03-08 LAB — LIPID PANEL
LDL Cholesterol: 58 mg/dL (ref 0–99)
Total CHOL/HDL Ratio: 2

## 2011-03-08 LAB — HEPATIC FUNCTION PANEL
Albumin: 4.1 g/dL (ref 3.5–5.2)
Total Bilirubin: 0.5 mg/dL (ref 0.3–1.2)

## 2011-03-08 LAB — MICROALBUMIN / CREATININE URINE RATIO: Microalb, Ur: 1 mg/dL (ref 0.0–1.9)

## 2011-03-08 NOTE — Progress Notes (Signed)
Subjective:    Patient ID: Vicki Price, female    DOB: 1950-05-12, 60 y.o.   MRN: [id number]  HPI here for regular wellness examination.  she's feeling pretty well in general, and says chronic med probs are stable, except as noted below.  Gyn is dr Stefano Gaul.   Past Medical History  Diagnosis Date  . HYPOTHYROIDISM, POST-RADIATION 03/07/2008  . DIABETES MELLITUS, TYPE II 12/24/2006    2004  . HYPERCHOLESTEROLEMIA 09/03/2008  . HYPERTENSION 09/03/2008  . SMOKER 03/07/2008  . CAROTID ARTERY STENOSIS, BILATERAL 03/07/2008    09/2003  . Unspecified disorder of liver 09/03/2008  . OVARIAN CYST, RIGHT 03/07/2008  . OSTEOPOROSIS 12/24/2006  . COLONIC POLYPS, HX OF 12/24/2006  . CHEST PAIN UNSPECIFIED 03/10/2009  . Hyperthyroidism 2004    Past Surgical History  Procedure Date  . Btl 1989  . I-131 therapy 06/26/2002  . Esophagogastroduodenoscopy 07/31/1982  . Gated spect wall motion stress cardiolite 12/04/2003  . Carotid duplex 09/06/2006    History   Social History  . Marital Status: Married    Spouse Name: N/A    Number of Children: N/A  . Years of Education: N/A   Occupational History  . Homemaker    Social History Main Topics  . Smoking status: Current Everyday Smoker  . Smokeless tobacco: Not on file  . Alcohol Use: Not on file  . Drug Use: Not on file  . Sexually Active: Not on file   Other Topics Concern  . Not on file   Social History Narrative   Does not work outside the home    Current Outpatient Prescriptions on File Prior to Visit  Medication Sig Dispense Refill  . aspirin 81 MG tablet Take 81 mg by mouth daily.        . CRESTOR 40 MG tablet TAKE ONE TABLET BY MOUTH EVERY DAY  30 each  5  . glucose blood (FREESTYLE LITE) test strip Use as instructed       . levothyroxine (SYNTHROID, LEVOTHROID) 100 MCG tablet TAKE ONE TABLET BY MOUTH EVERY DAY  30 tablet  5  . lisinopril-hydrochlorothiazide (PRINZIDE,ZESTORETIC) 10-12.5 MG per tablet TAKE ONE-HALF TABLET  BY MOUTH EVERY DAY  45 tablet  1  . metFORMIN (GLUCOPHAGE) 1000 MG tablet TAKE TWO TABLETS BY MOUTH EVERY DAY WITH FOOD  60 tablet  5    No Known Allergies  Family History  Problem Relation Age of Onset  . Cancer Neg Hx     BP 120/84  Pulse 77  Temp(Src) 97.4 F (36.3 C) (Oral)  Ht 5\' 1"  (1.549 m)  Wt 105 lb (47.628 kg)  BMI 19.84 kg/m2  SpO2 99%     Review of Systems  Constitutional: Negative for fever and unexpected weight change.  HENT: Negative for hearing loss.   Eyes: Negative for visual disturbance.  Respiratory: Negative for cough and shortness of breath.   Cardiovascular: Negative for chest pain.  Gastrointestinal: Negative for anal bleeding.  Genitourinary: Negative for hematuria.  Musculoskeletal: Negative for back pain.  Skin: Negative for rash.  Neurological: Negative for syncope, numbness and headaches.  Hematological: Does not bruise/bleed easily.  Psychiatric/Behavioral: Negative for dysphoric mood.       Objective:   Physical Exam VS: see vs page GEN: no distress HEAD: head: no deformity eyes: no periorbital swelling, no proptosis external nose and ears are normal mouth: no lesion seen NECK: supple, thyroid is not enlarged CHEST WALL: no deformity LUNGS:  Clear to auscultation BREASTS:  sees  gyn CV: reg rate and rhythm, no murmur ABD: abdomen is soft, nontender.  no hepatosplenomegaly.  not distended.  no hernia GENITALIA:  sees gyn RECTAL: sees gyn MUSCULOSKELETAL: muscle bulk and strength are grossly normal.  no obvious joint swelling.  gait is normal and steady EXTEMITIES: no deformity.  no ulcer on the feet.  feet are of normal color and temp.  no edema PULSES: dorsalis pedis intact bilat.  no carotid bruit NEURO:  cn 2-12 grossly intact.   readily moves all 4's.  sensation is intact to touch on the feet SKIN:  Normal texture and temperature.  No rash or suspicious lesion is visible.   NODES:  None palpable at the neck PSYCH: alert,  oriented x3.  Does not appear anxious nor depressed.        Assessment & Plan:  Wellness visit today, with problems stable

## 2011-03-08 NOTE — Patient Instructions (Addendum)
blood tests are being requested for you today.  please call [phone] to hear your test results.  You will be prompted to enter the 9-digit "MRN" number that appears at the top left of this page, followed by #.  Then you will hear the message. please consider these measures for your health:  minimize alcohol.  do not use tobacco products.  have a colonoscopy at least every 10 years from age 59.  Women should have an annual mammogram from age 3.  keep firearms safely stored.  always use seat belts.  have working smoke alarms in your home.  see an eye doctor and dentist regularly.  never drive under the influence of alcohol or drugs (including prescription drugs).   please let me know what your wishes would be, if artificial life support measures should become necessary.  it is critically important to prevent falling down (keep floor areas well-lit, dry, and free of loose objects.  If you have a cane, walker, or wheelchair, you should use it, even for short trips around the house.  Also, try not to rush).   Please come back for a follow-up appointment in 6 months. (update: i left message on phone-tree:  rx as we discussed)

## 2011-03-11 ENCOUNTER — Encounter: Payer: Self-pay | Admitting: Endocrinology

## 2011-04-16 ENCOUNTER — Other Ambulatory Visit: Payer: Self-pay | Admitting: Endocrinology

## 2011-05-07 ENCOUNTER — Other Ambulatory Visit: Payer: Self-pay | Admitting: Endocrinology

## 2011-05-10 ENCOUNTER — Other Ambulatory Visit: Payer: Self-pay | Admitting: Endocrinology

## 2011-08-23 ENCOUNTER — Other Ambulatory Visit: Payer: Self-pay | Admitting: Endocrinology

## 2011-09-07 ENCOUNTER — Ambulatory Visit: Payer: Federal, State, Local not specified - PPO | Admitting: Endocrinology

## 2011-09-13 ENCOUNTER — Other Ambulatory Visit (INDEPENDENT_AMBULATORY_CARE_PROVIDER_SITE_OTHER): Payer: Federal, State, Local not specified - PPO

## 2011-09-13 ENCOUNTER — Ambulatory Visit (INDEPENDENT_AMBULATORY_CARE_PROVIDER_SITE_OTHER): Payer: Federal, State, Local not specified - PPO | Admitting: Endocrinology

## 2011-09-13 ENCOUNTER — Encounter: Payer: Self-pay | Admitting: Endocrinology

## 2011-09-13 ENCOUNTER — Telehealth: Payer: Self-pay | Admitting: *Deleted

## 2011-09-13 VITALS — BP 134/84 | HR 77 | Temp 98.1°F | Ht 61.0 in | Wt 108.0 lb

## 2011-09-13 DIAGNOSIS — E119 Type 2 diabetes mellitus without complications: Secondary | ICD-10-CM

## 2011-09-13 LAB — HEMOGLOBIN A1C: Hgb A1c MFr Bld: 6.8 % — ABNORMAL HIGH (ref 4.6–6.5)

## 2011-09-13 MED ORDER — VARENICLINE TARTRATE 0.5 MG PO TABS: 0.5000 mg | ORAL_TABLET | Freq: Two times a day (BID) | ORAL | Status: AC

## 2011-09-13 NOTE — Patient Instructions (Addendum)
blood tests are being requested for you today.  You will receive a letter with results.  Please come back for a regular physical appointment in 6 months.  i have sent a prescription to your pharmacy, for help you quit smoking.    Smoking Cessation This document explains the best ways for you to quit smoking and new treatments to help. It lists new medicines that can double or triple your chances of quitting and quitting for good. It also considers ways to avoid relapses and concerns you may have about quitting, including weight gain. NICOTINE: A POWERFUL ADDICTION If you have tried to quit smoking, you know how hard it can be. It is hard because nicotine is a very addictive drug. For some people, it can be as addictive as heroin or cocaine. Usually, people make 2 or 3 tries, or more, before finally being able to quit. Each time you try to quit, you can learn about what helps and what hurts. Quitting takes hard work and a lot of effort, but you can quit smoking. QUITTING SMOKING IS ONE OF THE MOST IMPORTANT THINGS YOU WILL EVER DO.  You will live longer, feel better, and live better.   The impact on your body of quitting smoking is felt almost immediately:   Within 20 minutes, blood pressure decreases. Pulse returns to its normal level.   After 8 hours, carbon monoxide levels in the blood return to normal. Oxygen level increases.   After 24 hours, chance of heart attack starts to decrease. Breath, hair, and body stop smelling like smoke.   After 48 hours, damaged nerve endings begin to recover. Sense of taste and smell improve.   After 72 hours, the body is virtually free of nicotine. Bronchial tubes relax and breathing becomes easier.   After 2 to 12 weeks, lungs can hold more air. Exercise becomes easier and circulation improves.   Quitting will reduce your risk of having a heart attack, stroke, cancer, or lung disease:   After 1 year, the risk of coronary heart disease is cut in half.     After 5 years, the risk of stroke falls to the same as a nonsmoker.   After 10 years, the risk of lung cancer is cut in half and the risk of other cancers decreases significantly.   After 15 years, the risk of coronary heart disease drops, usually to the level of a nonsmoker.   If you are pregnant, quitting smoking will improve your chances of having a healthy baby.   The people you live with, especially your children, will be healthier.   You will have extra money to spend on things other than cigarettes.  FIVE KEYS TO QUITTING Studies have shown that these 5 steps will help you quit smoking and quit for good. You have the best chances of quitting if you use them together: 1. Get ready.  2. Get support and encouragement.  3. Learn new skills and behaviors.  4. Get medicine to reduce your nicotine addiction and use it correctly.  5. Be prepared for relapse or difficult situations. Be determined to continue trying to quit, even if you do not succeed at first.  1. GET READY  Set a quit date.   Change your environment.   Get rid of ALL cigarettes, ashtrays, matches, and lighters in your home, car, and place of work.   Do not let people smoke in your home.   Review your past attempts to quit. Think about what worked and  what did not.   Once you quit, do not smoke. NOT EVEN A PUFF!  2. GET SUPPORT AND ENCOURAGEMENT Studies have shown that you have a better chance of being successful if you have help. You can get support in many ways.  Tell your family, friends, and coworkers that you are going to quit and need their support. Ask them not to smoke around you.   Talk to your caregivers (doctor, dentist, nurse, pharmacist, psychologist, and/or smoking counselor).   Get individual, group, or telephone counseling and support. The more counseling you have, the better your chances are of quitting. Programs are available at [employer] and health centers. Call your local health  department for information about programs in your area.   Spiritual beliefs and practices may help some smokers quit.   Quit meters are Photographer that keep track of quit statistics, such as amount of "quit-time," cigarettes not smoked, and money saved.   Many smokers find one or more of the many self-help books available useful in helping them quit and stay off tobacco.  3. LEARN NEW SKILLS AND BEHAVIORS  Try to distract yourself from urges to smoke. Talk to someone, go for a walk, or occupy your time with a task.   When you first try to quit, change your routine. Take a different route to work. Drink tea instead of coffee. Eat breakfast in a different place.   Do something to reduce your stress. Take a hot bath, exercise, or read a book.   Plan something enjoyable to do every day. Reward yourself for not smoking.   Explore interactive web-based programs that specialize in helping you quit.  4. GET MEDICINE AND USE IT CORRECTLY Medicines can help you stop smoking and decrease the urge to smoke. Combining medicine with the above behavioral methods and support can quadruple your chances of successfully quitting smoking. The [place] Food and Drug Administration (FDA) has approved 7 medicines to help you quit smoking. These medicines fall into 3 categories.  Nicotine replacement therapy (delivers nicotine to your body without the negative effects and risks of smoking):   Nicotine gum: Available over-the-counter.   Nicotine lozenges: Available over-the-counter.   Nicotine inhaler: Available by prescription.   Nicotine nasal spray: Available by prescription.   Nicotine skin patches (transdermal): Available by prescription and over-the-counter.   Antidepressant medicine (helps people abstain from smoking, but how this works is unknown):   Bupropion sustained-release (SR) tablets: Available by prescription.   Nicotinic receptor partial agonist  (simulates the effect of nicotine in your brain):   Varenicline tartrate tablets: Available by prescription.   Ask your caregiver for advice about which medicines to use and how to use them. Carefully read the information on the package.   Everyone who is trying to quit may benefit from using a medicine. If you are pregnant or trying to become pregnant, nursing an infant, you are under age 76, or you smoke fewer than 10 cigarettes per day, talk to your caregiver before taking any nicotine replacement medicines.   You should stop using a nicotine replacement product and call your caregiver if you experience nausea, dizziness, weakness, vomiting, fast or irregular heartbeat, mouth problems with the lozenge or gum, or redness or swelling of the skin around the patch that does not go away.   Do not use any other product containing nicotine while using a nicotine replacement product.   Talk to your caregiver before using these products if you have  diabetes, heart disease, asthma, stomach ulcers, you had a recent heart attack, you have high blood pressure that is not controlled with medicine, a history of irregular heartbeat, or you have been prescribed medicine to help you quit smoking.  5. BE PREPARED FOR RELAPSE OR DIFFICULT SITUATIONS  Most relapses occur within the first 3 months after quitting. Do not be discouraged if you start smoking again. Remember, most people try several times before they finally quit.   You may have symptoms of withdrawal because your body is used to nicotine. You may crave cigarettes, be irritable, feel very hungry, cough often, get headaches, or have difficulty concentrating.   The withdrawal symptoms are only temporary. They are strongest when you first quit, but they will go away within 10 to 14 days.  Here are some difficult situations to watch for:  Alcohol. Avoid drinking alcohol. Drinking lowers your chances of successfully quitting.   Caffeine. Try to reduce  the amount of caffeine you consume. It also lowers your chances of successfully quitting.   Other smokers. Being around smoking can make you want to smoke. Avoid smokers.   Weight gain. Many smokers will gain weight when they quit, usually less than 10 pounds. Eat a healthy diet and stay active. Do not let weight gain distract you from your main goal, quitting smoking. Some medicines that help you quit smoking may also help delay weight gain. You can always lose the weight gained after you quit.   Bad mood or depression. There are a lot of ways to improve your mood other than smoking.  If you are having problems with any of these situations, talk to your caregiver. SPECIAL SITUATIONS AND CONDITIONS Studies suggest that everyone can quit smoking. Your situation or condition can give you a special reason to quit.  Pregnant women/new mothers: By quitting, you protect your baby's health and your own.   Hospitalized patients: By quitting, you reduce health problems and help healing.   Heart attack patients: By quitting, you reduce your risk of a second heart attack.   Lung, head, and neck cancer patients: By quitting, you reduce your chance of a second cancer.   Parents of children and adolescents: By quitting, you protect your children from illnesses caused by secondhand smoke.  QUESTIONS TO THINK ABOUT Think about the following questions before you try to stop smoking. You may want to talk about your answers with your caregiver.  Why do you want to quit?   If you tried to quit in the past, what helped and what did not?   What will be the most difficult situations for you after you quit? How will you plan to handle them?   Who can help you through the tough times? Your family? Friends? Caregiver?   What pleasures do you get from smoking? What ways can you still get pleasure if you quit?  Here are some questions to ask your caregiver:  How can you help me to be successful at quitting?     What medicine do you think would be best for me and how should I take it?   What should I do if I need more help?   What is smoking withdrawal like? How can I get information on withdrawal?  Quitting takes hard work and a lot of effort, but you can quit smoking. FOR MORE INFORMATION   Smokefree.gov (http://www.davis-sullivan.com/) provides free, accurate, evidence-based information and professional assistance to help support the immediate and long-term needs of people trying to  quit smoking. Document Released: 04/20/2001 Document Revised: 04/15/2011 Document Reviewed: 02/10/2009 [facility] Patient Information 2012 [place].

## 2011-09-13 NOTE — Telephone Encounter (Signed)
Called pt to inform of A1c results, pt informed (letter also mailed to pt). 

## 2011-09-13 NOTE — Progress Notes (Signed)
  Subjective:    Patient ID: Vicki Price, female    DOB: 11/12/1950, 61 y.o.   MRN: [id number]  HPI Pt returns for f/u of type 2 DM (dx'ed 1999, complicated by PAD).  no cbg record, but states cbg's are well-controlled.  pt states she feels well in general. Past Medical History  Diagnosis Date  . HYPOTHYROIDISM, POST-RADIATION 03/07/2008  . DIABETES MELLITUS, TYPE II 12/24/2006    2004  . HYPERCHOLESTEROLEMIA 09/03/2008  . HYPERTENSION 09/03/2008  . SMOKER 03/07/2008  . CAROTID ARTERY STENOSIS, BILATERAL 03/07/2008    09/2003  . Unspecified disorder of liver 09/03/2008  . OVARIAN CYST, RIGHT 03/07/2008  . OSTEOPOROSIS 12/24/2006  . COLONIC POLYPS, HX OF 12/24/2006  . CHEST PAIN UNSPECIFIED 03/10/2009  . Hyperthyroidism 2004    Past Surgical History  Procedure Date  . Btl 1989  . I-131 therapy 06/26/2002  . Esophagogastroduodenoscopy 07/31/1982  . Gated spect wall motion stress cardiolite 12/04/2003  . Carotid duplex 09/06/2006    History   Social History  . Marital Status: Married    Spouse Name: N/A    Number of Children: N/A  . Years of Education: N/A   Occupational History  . Homemaker    Social History Main Topics  . Smoking status: Current Everyday Smoker  . Smokeless tobacco: Not on file  . Alcohol Use: Not on file  . Drug Use: Not on file  . Sexually Active: Not on file   Other Topics Concern  . Not on file   Social History Narrative   Does not work outside the home    Current Outpatient Prescriptions on File Prior to Visit  Medication Sig Dispense Refill  . aspirin 81 MG tablet Take 81 mg by mouth daily.        . CRESTOR 40 MG tablet TAKE ONE TABLET BY MOUTH EVERY DAY  30 each  2  . glucose blood (FREESTYLE LITE) test strip Use as instructed       . levothyroxine (SYNTHROID, LEVOTHROID) 100 MCG tablet TAKE ONE TABLET BY MOUTH EVERY DAY  30 tablet  9  . lisinopril-hydrochlorothiazide (PRINZIDE,ZESTORETIC) 10-12.5 MG per tablet TAKE ONE-HALF TABLET BY  MOUTH EVERY DAY  45 tablet  2  . metFORMIN (GLUCOPHAGE) 1000 MG tablet TAKE TWO TABLETS BY MOUTH EVERY DAY WITH FOOD  60 tablet  9    No Known Allergies  Family History  Problem Relation Age of Onset  . Cancer Neg Hx     BP 134/84  Pulse 77  Temp(Src) 98.1 F (36.7 C) (Oral)  Ht 5\' 1"  (1.549 m)  Wt 108 lb (48.988 kg)  BMI 20.41 kg/m2  SpO2 99%    Review of Systems Denies weight change    Objective:   Physical Exam VITAL SIGNS:  See vs page GENERAL: no distress Pulses: dorsalis pedis intact bilat.   Feet: no deformity.  no ulcer on the feet.  feet are of normal color and temp.  no edema Neuro: sensation is intact to touch on the feet.    Lab Results  Component Value Date   HGBA1C 6.8* 09/13/2011      Assessment & Plan:  DM, well-controlled We spent at least 3 minutes discussing smoking cessation--see below.

## 2011-10-20 LAB — HM MAMMOGRAPHY: HM Mammogram: NEGATIVE

## 2012-02-14 ENCOUNTER — Other Ambulatory Visit: Payer: Self-pay | Admitting: Endocrinology

## 2012-03-09 ENCOUNTER — Encounter: Payer: Self-pay | Admitting: Endocrinology

## 2012-03-10 ENCOUNTER — Other Ambulatory Visit: Payer: Self-pay

## 2012-03-10 DIAGNOSIS — E119 Type 2 diabetes mellitus without complications: Secondary | ICD-10-CM

## 2012-03-10 DIAGNOSIS — E039 Hypothyroidism, unspecified: Secondary | ICD-10-CM

## 2012-03-20 ENCOUNTER — Encounter: Payer: Federal, State, Local not specified - PPO | Admitting: Endocrinology

## 2012-03-22 ENCOUNTER — Other Ambulatory Visit (INDEPENDENT_AMBULATORY_CARE_PROVIDER_SITE_OTHER): Payer: Federal, State, Local not specified - PPO

## 2012-03-22 DIAGNOSIS — E039 Hypothyroidism, unspecified: Secondary | ICD-10-CM

## 2012-03-22 DIAGNOSIS — E119 Type 2 diabetes mellitus without complications: Secondary | ICD-10-CM

## 2012-03-23 LAB — PTH, INTACT AND CALCIUM: Calcium, Total (PTH): 9.3 mg/dL (ref 8.4–10.5)

## 2012-03-29 ENCOUNTER — Ambulatory Visit (INDEPENDENT_AMBULATORY_CARE_PROVIDER_SITE_OTHER): Payer: Federal, State, Local not specified - PPO | Admitting: Endocrinology

## 2012-03-29 ENCOUNTER — Encounter: Payer: Self-pay | Admitting: Endocrinology

## 2012-03-29 VITALS — BP 112/68 | HR 103 | Temp 98.5°F | Wt 105.0 lb

## 2012-03-29 DIAGNOSIS — L309 Dermatitis, unspecified: Secondary | ICD-10-CM

## 2012-03-29 DIAGNOSIS — L259 Unspecified contact dermatitis, unspecified cause: Secondary | ICD-10-CM

## 2012-03-29 DIAGNOSIS — E890 Postprocedural hypothyroidism: Secondary | ICD-10-CM

## 2012-03-29 DIAGNOSIS — I10 Essential (primary) hypertension: Secondary | ICD-10-CM

## 2012-03-29 DIAGNOSIS — E119 Type 2 diabetes mellitus without complications: Secondary | ICD-10-CM

## 2012-03-29 DIAGNOSIS — K769 Liver disease, unspecified: Secondary | ICD-10-CM

## 2012-03-29 DIAGNOSIS — Z79899 Other long term (current) drug therapy: Secondary | ICD-10-CM

## 2012-03-29 LAB — HEPATIC FUNCTION PANEL
Albumin: 4.3 g/dL (ref 3.5–5.2)
Alkaline Phosphatase: 68 U/L (ref 39–117)
Total Bilirubin: 0.4 mg/dL (ref 0.3–1.2)

## 2012-03-29 LAB — LIPID PANEL
Cholesterol: 142 mg/dL (ref 0–200)
HDL: 59 mg/dL (ref >39)
Total CHOL/HDL Ratio: 2.4 Ratio
Triglycerides: 123 mg/dL (ref <150)
VLDL: 25 mg/dL (ref 0–40)

## 2012-03-29 LAB — BASIC METABOLIC PANEL
BUN: 9 mg/dL (ref 6–23)
Chloride: 106 mEq/L (ref 96–112)
Glucose, Bld: 124 mg/dL — ABNORMAL HIGH (ref 70–99)
Potassium: 3.8 mEq/L (ref 3.5–5.3)

## 2012-03-29 NOTE — Patient Instructions (Addendum)
Please come back for a follow-up appointment in 3 months.  blood tests are being requested for you today.  We'll contact you with results. Try putting non-prescription hydrocortisone cream into the ears 3x a day, as needed for the itching. please consider these measures for your health:  minimize alcohol.  do not use tobacco products.  have a colonoscopy at least every 10 years from age 60.  Women should have an annual mammogram from age 56.  keep firearms safely stored.  always use seat belts.  have working smoke alarms in your home.  see an eye doctor and dentist regularly.  never drive under the influence of alcohol or drugs (including prescription drugs).   please let me know what your wishes would be, if artificial life support measures should become necessary.  it is critically important to prevent falling down (keep floor areas well-lit, dry, and free of loose objects.  If you have a cane, walker, or wheelchair, you should use it, even for short trips around the house.  Also, try not to rush). (update: we discussed code status.  pt requests full code, but would not want to be started or maintained on artificial life-support measures if there was not a reasonable chance of recovery).

## 2012-03-29 NOTE — Progress Notes (Signed)
Subjective:    Patient ID: Vicki Price, female    DOB: 21-Nov-1950, 61 y.o.   MRN: [id number]  HPI here for regular wellness examination.  she's feeling pretty well in general, and says chronic med probs are stable, except as noted below Past Medical History  Diagnosis Date  . HYPOTHYROIDISM, POST-RADIATION 03/07/2008  . DIABETES MELLITUS, TYPE II 12/24/2006    2004  . HYPERCHOLESTEROLEMIA 09/03/2008  . HYPERTENSION 09/03/2008  . SMOKER 03/07/2008  . CAROTID ARTERY STENOSIS, BILATERAL 03/07/2008    09/2003  . Unspecified disorder of liver 09/03/2008  . OVARIAN CYST, RIGHT 03/07/2008  . OSTEOPOROSIS 12/24/2006  . COLONIC POLYPS, HX OF 12/24/2006  . CHEST PAIN UNSPECIFIED 03/10/2009  . Hyperthyroidism 2004    Past Surgical History  Procedure Date  . Btl 1989  . I-131 therapy 06/26/2002  . Esophagogastroduodenoscopy 07/31/1982  . Gated spect wall motion stress cardiolite 12/04/2003  . Carotid duplex 09/06/2006    History   Social History  . Marital Status: Married    Spouse Name: N/A    Number of Children: N/A  . Years of Education: N/A   Occupational History  . Homemaker    Social History Main Topics  . Smoking status: Current Every Day Smoker  . Smokeless tobacco: Not on file  . Alcohol Use: Not on file  . Drug Use: Not on file  . Sexually Active: Not on file   Other Topics Concern  . Not on file   Social History Narrative   Does not work outside the home    Current Outpatient Prescriptions on File Prior to Visit  Medication Sig Dispense Refill  . aspirin 81 MG tablet Take 81 mg by mouth daily.        . CRESTOR 40 MG tablet TAKE ONE TABLET BY MOUTH EVERY DAY  30 tablet  2  . glucose blood (FREESTYLE LITE) test strip Use as instructed       . levothyroxine (SYNTHROID, LEVOTHROID) 100 MCG tablet TAKE ONE TABLET BY MOUTH EVERY DAY  30 tablet  9  . lisinopril-hydrochlorothiazide (PRINZIDE,ZESTORETIC) 10-12.5 MG per tablet TAKE ONE-HALF TABLET BY MOUTH EVERY DAY   45 tablet  2  . metFORMIN (GLUCOPHAGE) 1000 MG tablet TAKE TWO TABLETS BY MOUTH EVERY DAY WITH FOOD  60 tablet  9    No Known Allergies  Family History  Problem Relation Age of Onset  . Cancer Neg Hx     BP 112/68  Pulse 103  Temp 98.5 F (36.9 C) (Oral)  Wt 105 lb (47.628 kg)  SpO2 96%    Review of Systems  Constitutional: Negative for unexpected weight change.  HENT: Negative for hearing loss.   Eyes: Negative for visual disturbance.  Respiratory: Negative for shortness of breath.   Cardiovascular: Negative for chest pain.  Gastrointestinal: Negative for anal bleeding.  Genitourinary: Negative for hematuria.  Musculoskeletal: Negative for back pain.  Skin: Negative for rash.  Neurological: Negative for syncope and numbness.  Hematological: Does not bruise/bleed easily.  Psychiatric/Behavioral: Negative for dysphoric mood.       Objective:   Physical Exam VS: see vs page GEN: no distress HEAD: head: no deformity eyes: no periorbital swelling, no proptosis external nose and ears are normal mouth: no lesion seen NECK: supple, thyroid is not enlarged CHEST WALL: no deformity LUNGS:  Clear to auscultation BREASTS:  sees gyn CV: reg rate and rhythm, no murmur ABD: abdomen is soft, nontender.  no hepatosplenomegaly.  not distended.  no hernia  GENITALIA/RECTAL: sees gyn MUSCULOSKELETAL: muscle bulk and strength are grossly normal.  no obvious joint swelling.  gait is normal and steady EXTEMITIES: no deformity.  no ulcer on the feet.  feet are of normal color and temp.  no edema PULSES: dorsalis pedis intact bilat.  no carotid bruit NEURO:  cn 2-12 grossly intact.   readily moves all 4's.  sensation is intact to touch on the feet SKIN:  Normal texture and temperature.  No rash or suspicious lesion is visible.   NODES:  None palpable at the neck PSYCH: alert, oriented x3.  Does not appear anxious nor depressed.     Assessment & Plan:  Wellness visit today, with  problems stable, except as noted.   SEPARATE EVALUATION FOLLOWS--EACH PROBLEM HERE IS NEW, NOT RESPONDING TO TREATMENT, OR POSES SIGNIFICANT RISK TO THE PATIENT'S HEALTH: HISTORY OF THE PRESENT ILLNESS: Pt states few mos of moderate itching of the right eac, burt no assoc pain PAST MEDICAL HISTORY reviewed and up to date today REVIEW OF SYSTEMS: Denies fever PHYSICAL EXAMINATION: VITAL SIGNS:  See vs page GENERAL: no distress Both eac's and tm's are normal IMPRESSION: Eczema of eac, new PLAN: See instruction page

## 2012-03-30 ENCOUNTER — Telehealth: Payer: Self-pay | Admitting: *Deleted

## 2012-03-30 LAB — CBC WITH DIFFERENTIAL/PLATELET
Basophils Relative: 0 % (ref 0–1)
HCT: 45.2 % (ref 36.0–46.0)
Hemoglobin: 15.6 g/dL — ABNORMAL HIGH (ref 12.0–15.0)
Lymphocytes Relative: 28 % (ref 12–46)
MCHC: 34.5 g/dL (ref 30.0–36.0)
Monocytes Absolute: 0.4 10*3/uL (ref 0.1–1.0)
Monocytes Relative: 7 % (ref 3–12)
Neutro Abs: 3.9 10*3/uL (ref 1.7–7.7)

## 2012-03-30 LAB — URINALYSIS, ROUTINE W REFLEX MICROSCOPIC
Bilirubin Urine: NEGATIVE
Glucose, UA: NEGATIVE mg/dL
Specific Gravity, Urine: 1.011 (ref 1.005–1.030)
pH: 5 (ref 5.0–8.0)

## 2012-03-30 NOTE — Telephone Encounter (Signed)
PATIENT NOTIFIED OF LAB RESULTS ARE GOOD. CONTINUE MEDICATIONS PER DR. ELLISON.FOLLOW UP IN 3 MONTHS

## 2012-03-30 NOTE — Telephone Encounter (Signed)
Message copied by Elnora Morrison on Thu Mar 30, 2012  4:11 PM ------      Message from: Romero Belling      Created: Thu Mar 30, 2012 11:39 AM       please call patient:      Test results are good      Please continue the same medications      i'll see you next time.

## 2012-04-05 DIAGNOSIS — L309 Dermatitis, unspecified: Secondary | ICD-10-CM | POA: Insufficient documentation

## 2012-04-21 ENCOUNTER — Encounter: Payer: Self-pay | Admitting: Endocrinology

## 2012-05-01 ENCOUNTER — Other Ambulatory Visit: Payer: Self-pay | Admitting: Endocrinology

## 2012-05-08 ENCOUNTER — Other Ambulatory Visit: Payer: Self-pay | Admitting: Endocrinology

## 2012-05-11 ENCOUNTER — Ambulatory Visit: Payer: Self-pay | Admitting: Obstetrics and Gynecology

## 2012-05-22 ENCOUNTER — Ambulatory Visit (INDEPENDENT_AMBULATORY_CARE_PROVIDER_SITE_OTHER): Payer: BC Managed Care – PPO | Admitting: Obstetrics and Gynecology

## 2012-05-22 ENCOUNTER — Encounter: Payer: Self-pay | Admitting: Obstetrics and Gynecology

## 2012-05-22 VITALS — BP 130/70 | HR 70 | Resp 16 | Ht 61.0 in | Wt 110.0 lb

## 2012-05-22 DIAGNOSIS — Z01419 Encounter for gynecological examination (general) (routine) without abnormal findings: Secondary | ICD-10-CM

## 2012-05-22 DIAGNOSIS — Z124 Encounter for screening for malignant neoplasm of cervix: Secondary | ICD-10-CM

## 2012-05-22 NOTE — Progress Notes (Signed)
Subjective:    Vicki Price is a 62 y.o. female G3P3 who presents for annual exam. The patient complaints of Occasional hot flash.  The following portions of the patient's history were reviewed and updated as appropriate: allergies, current medications, past family history, past medical history, past social history, past surgical history and problem list.  Review of Systems Pertinent items are noted in HPI. Gastrointestinal:No change in bowel habits, no abdominal pain, no rectal bleeding Genitourinary:negative for dysuria, frequency, hematuria, nocturia and urinary incontinence    Objective:     BP 130/70  Pulse 70  Resp 16  Ht 5\' 1"  (1.549 m)  Wt 110 lb (49.896 kg)  BMI 20.78 kg/m2  Weight:  Wt Readings from Last 1 Encounters:  05/22/12 110 lb (49.896 kg)     BMI: Body mass index is 20.78 kg/(m^2). General Appearance: Alert, appropriate appearance for age. No acute distress HEENT: Grossly normal Neck / Thyroid: Supple, no masses, nodes or enlargement Lungs: clear to auscultation bilaterally Back: No CVA tenderness Breast Exam: No masses or nodes.No dimpling, nipple retraction or discharge. Cardiovascular: Regular rate and rhythm. S1, S2, no murmur Gastrointestinal: Soft, non-tender, no masses or organomegaly  ++++++++++++++++++++++++++++++++++++++++++++++++++++++++  Pelvic Exam: Vulva and vagina appear normal. Bimanual exam reveals normal uterus and adnexa. Urinary system: urethral meatus normal Vaginal: atrophic mucosa Cervix: normal appearance Adnexa: normal bimanual exam Rectovaginal: normal rectal, no masses  ++++++++++++++++++++++++++++++++++++++++++++++++++++++++  Lymphatic Exam: Non-palpable nodes in neck, clavicular, axillary, or inguinal regions  Psychiatric: Alert and oriented, appropriate affect.      Assessment:    Normal gyn exam   Overweight or obese: No  Pelvic relaxation: No  Menopausal symptoms: Yes. Severe:  No.  Diabetes  Hypothyroidism  Cigarette smoker   Plan:    Mammogram. Pap smear.   Follow-up:  for annual exam  The updated Pap smear screening guidelines were discussed with the patient. The patient requested that I obtain a Pap smear: Yes. Repeat in 2017.  Kegel exercises discussed: Yes.  Smoking cessation reviewed.  Proper diet and regular exercise were reviewed.  Annual mammograms recommended starting at age 87. Proper breast care was discussed.  Screening colonoscopy is recommended beginning at age 29.  Regular health maintenance was reviewed.  Sleep hygiene was discussed.  Adequate calcium and vitamin D intake was emphasized.  Leonard Schwartz M.D.   Regular Periods: no Mammogram: yes  Monthly Breast Ex.: yes Exercise: yes  Tetanus < 10 years: yes Seatbelts: yes  NI. Bladder Functn.: yes Abuse at home: no  Daily BM's: yes Stressful Work: no  Healthy Diet: yes Sigmoid-Colonoscopy: 2009  Calcium: yes Medical problems this year: None   LAST PAP:03/26/2009  Contraception: Postmenopausal  Mammogram:  2013 [facility]  PCP: Romero Belling  PMH: None  FMH: None  Last Bone Scan: 2011 [facility]

## 2012-05-23 LAB — PAP IG W/ RFLX HPV ASCU

## 2012-06-19 ENCOUNTER — Other Ambulatory Visit: Payer: Self-pay | Admitting: Endocrinology

## 2012-06-30 ENCOUNTER — Ambulatory Visit (INDEPENDENT_AMBULATORY_CARE_PROVIDER_SITE_OTHER): Payer: Federal, State, Local not specified - PPO | Admitting: Endocrinology

## 2012-06-30 VITALS — BP 124/80 | HR 84 | Wt 107.0 lb

## 2012-06-30 DIAGNOSIS — E119 Type 2 diabetes mellitus without complications: Secondary | ICD-10-CM

## 2012-06-30 NOTE — Patient Instructions (Addendum)
Please come back for a follow-up appointment in 3 months.  blood tests are being requested for you today.  We'll contact you with results. if the blood test is high, please start taking "bromocriptine," to help your blood sugar. It has possible side effects of nausea and dizziness.  These go away with time.  You can avoid these by taking it at bedtime, and by taking just take 1/2 pill for the first week.

## 2012-06-30 NOTE — Progress Notes (Signed)
  Subjective:    Patient ID: Vicki Price, female    DOB: 1950/07/31, 62 y.o.   MRN: [id number]  HPI Pt returns for f/u of type 2 DM (dx'ed 1999, complicated by PAD). no cbg record, but states cbg's are well-controlled. pt states she feels well in general.   Past Medical History  Diagnosis Date  . HYPOTHYROIDISM, POST-RADIATION 03/07/2008  . DIABETES MELLITUS, TYPE II 12/24/2006    2004  . HYPERCHOLESTEROLEMIA 09/03/2008  . HYPERTENSION 09/03/2008  . SMOKER 03/07/2008  . CAROTID ARTERY STENOSIS, BILATERAL 03/07/2008    09/2003  . Unspecified disorder of liver 09/03/2008  . OVARIAN CYST, RIGHT 03/07/2008  . OSTEOPOROSIS 12/24/2006  . COLONIC POLYPS, HX OF 12/24/2006  . CHEST PAIN UNSPECIFIED 03/10/2009  . Hyperthyroidism 2004    Past Surgical History  Procedure Laterality Date  . Btl  1989  . I-131 therapy  06/26/2002  . Esophagogastroduodenoscopy  07/31/1982  . Gated spect wall motion stress cardiolite  12/04/2003  . Carotid duplex  09/06/2006    History   Social History  . Marital Status: Married    Spouse Name: N/A    Number of Children: N/A  . Years of Education: N/A   Occupational History  . Homemaker    Social History Main Topics  . Smoking status: Current Every Day Smoker  . Smokeless tobacco: Not on file  . Alcohol Use: Not on file  . Drug Use: Not on file  . Sexually Active: Yes    Birth Control/ Protection: Surgical     Comment: BTL   Other Topics Concern  . Not on file   Social History Narrative   Does not work outside the home    Current Outpatient Prescriptions on File Prior to Visit  Medication Sig Dispense Refill  . aspirin 81 MG tablet Take 81 mg by mouth daily.        . CRESTOR 40 MG tablet TAKE ONE TABLET BY MOUTH EVERY DAY  30 tablet  1  . glucose blood (FREESTYLE LITE) test strip Use as instructed       . levothyroxine (SYNTHROID, LEVOTHROID) 100 MCG tablet TAKE ONE TABLET BY MOUTH EVERY DAY  30 tablet  8  . lisinopril-hydrochlorothiazide  (PRINZIDE,ZESTORETIC) 10-12.5 MG per tablet TAKE ONE-HALF TABLET BY MOUTH EVERY DAY  45 tablet  1  . metFORMIN (GLUCOPHAGE) 1000 MG tablet TAKE TWO TABLETS BY MOUTH EVERY DAY WITH FOOD  60 tablet  8   No current facility-administered medications on file prior to visit.    No Known Allergies  Family History  Problem Relation Age of Onset  . Cancer Neg Hx   . Diabetes Mother   . Hypertension Brother    BP 124/80  Pulse 84  Wt 107 lb (48.535 kg)  BMI 20.23 kg/m2  SpO2 94%  Review of Systems denies hypoglycemia    Objective:   Physical Exam VITAL SIGNS:  See vs page GENERAL: no distress Ext: no edema    Lab Results  Component Value Date   HGBA1C 7.4* 06/30/2012      Assessment & Plan:  DM: Needs increased rx, if it can be done with a regimen that avoids or minimizes hypoglycemia.

## 2012-07-01 MED ORDER — BROMOCRIPTINE MESYLATE 2.5 MG PO TABS: 2.5000 mg | ORAL_TABLET | Freq: Every day | ORAL | Status: DC

## 2012-08-28 ENCOUNTER — Other Ambulatory Visit: Payer: Self-pay | Admitting: *Deleted

## 2012-08-28 ENCOUNTER — Other Ambulatory Visit: Payer: Self-pay | Admitting: Endocrinology

## 2012-08-28 MED ORDER — ROSUVASTATIN CALCIUM 40 MG PO TABS: 40.0000 mg | ORAL_TABLET | Freq: Every day | ORAL | Status: DC

## 2012-08-28 MED ORDER — LEVOTHYROXINE SODIUM 100 MCG PO TABS: 100.0000 ug | ORAL_TABLET | Freq: Every day | ORAL | Status: DC

## 2012-10-26 ENCOUNTER — Emergency Department (INDEPENDENT_AMBULATORY_CARE_PROVIDER_SITE_OTHER)
Admission: EM | Admit: 2012-10-26 | Discharge: 2012-10-26 | Disposition: A | Discharge status: Home / Self Care | Payer: Federal, State, Local not specified - PPO | Source: Home / Self Care | Attending: Family Medicine | Admitting: Family Medicine

## 2012-10-26 ENCOUNTER — Encounter (HOSPITAL_COMMUNITY): Payer: Self-pay | Admitting: Emergency Medicine

## 2012-10-26 DIAGNOSIS — H6091 Unspecified otitis externa, right ear: Secondary | ICD-10-CM

## 2012-10-26 DIAGNOSIS — H60399 Other infective otitis externa, unspecified ear: Secondary | ICD-10-CM

## 2012-10-26 MED ORDER — CIPROFLOXACIN-DEXAMETHASONE 0.3-0.1 % OT SUSP: 4.0000 [drp] | Freq: Two times a day (BID) | OTIC | Status: DC

## 2012-10-26 NOTE — ED Notes (Signed)
Pt c/o bug flew into her right ear about one hour ago when she was in her garden. Feels like something is still fluttering in her ear. Pt is alert and oriented.

## 2012-10-28 NOTE — ED Provider Notes (Signed)
History     CSN: [id number]  Arrival date & time 10/26/12  1339   First MD Initiated Contact with Patient 10/26/12 1409      Chief Complaint  Patient presents with  . Foreign Body in Ear    (Consider location/radiation/quality/duration/timing/severity/associated sxs/prior treatment) HPI Comments: 62 y/o female with h/o diabetes here c/o right ear pain after feeling an insect getting in her right ear canal and "fluttering inside" about 1 hour ago, while she was working in her garden.   Past Medical History  Diagnosis Date  . HYPOTHYROIDISM, POST-RADIATION 03/07/2008  . DIABETES MELLITUS, TYPE II 12/24/2006    2004  . HYPERCHOLESTEROLEMIA 09/03/2008  . HYPERTENSION 09/03/2008  . SMOKER 03/07/2008  . CAROTID ARTERY STENOSIS, BILATERAL 03/07/2008    09/2003  . Unspecified disorder of liver 09/03/2008  . OVARIAN CYST, RIGHT 03/07/2008  . OSTEOPOROSIS 12/24/2006  . COLONIC POLYPS, HX OF 12/24/2006  . CHEST PAIN UNSPECIFIED 03/10/2009  . Hyperthyroidism 2004    Past Surgical History  Procedure Laterality Date  . Btl  1989  . I-131 therapy  06/26/2002  . Esophagogastroduodenoscopy  07/31/1982  . Gated spect wall motion stress cardiolite  12/04/2003  . Carotid duplex  09/06/2006    Family History  Problem Relation Age of Onset  . Cancer Neg Hx   . Diabetes Mother   . Hypertension Brother     History  Substance Use Topics  . Smoking status: Current Every Day Smoker  . Smokeless tobacco: Not on file  . Alcohol Use: Yes     Comment: occasionally    OB History   Grav Para Term Preterm Abortions TAB SAB Ect Mult Living   3 3        3       Review of Systems  Constitutional: Negative for fever, chills and appetite change.  HENT: Positive for ear pain. Negative for hearing loss, congestion, neck pain and ear discharge.   Eyes: Negative for discharge.  Respiratory: Negative for cough and shortness of breath.   Cardiovascular: Negative for chest pain.  Skin: Negative for  rash.  Neurological: Negative for dizziness and headaches.    Allergies  Review of patient's allergies indicates no known allergies.  Home Medications   Current Outpatient Rx  Name  Route  Sig  Dispense  Refill  . metFORMIN (GLUCOPHAGE) 1000 MG tablet      TAKE TWO TABLETS BY MOUTH EVERY DAY WITH FOOD   60 tablet   8   . rosuvastatin (CRESTOR) 40 MG tablet   Oral   Take 1 tablet (40 mg total) by mouth daily.   30 tablet   4   . aspirin 81 MG tablet   Oral   Take 81 mg by mouth daily.           . bromocriptine (PARLODEL) 2.5 MG tablet   Oral   Take 1 tablet (2.5 mg total) by mouth at bedtime.   30 tablet   11   . ciprofloxacin-dexamethasone (CIPRODEX) otic suspension   Right Ear   Place 4 drops into the right ear 2 (two) times daily. For 5-7 days   7.5 mL   0   . glucose blood (FREESTYLE LITE) test strip      Use as instructed          . levothyroxine (SYNTHROID, LEVOTHROID) 100 MCG tablet   Oral   Take 1 tablet (100 mcg total) by mouth daily before breakfast.   30 tablet  5     Ok to change manufactures.   Marland Kitchen lisinopril-hydrochlorothiazide (PRINZIDE,ZESTORETIC) 10-12.5 MG per tablet      TAKE ONE-HALF TABLET BY MOUTH EVERY DAY   45 tablet   1     BP 144/87  Pulse 79  Temp(Src) 97.6 F (36.4 C) (Oral)  Resp 12  SpO2 100%  Physical Exam  Nursing note and vitals reviewed. Constitutional: She is oriented to person, place, and time. She appears well-developed and well-nourished. No distress.  HENT:  Head: Normocephalic and atraumatic.  Right ear: there is erythema and mild swelling of the ear canal. No abrasions no drainage. No foreign body or insects on observation or after irrigation.  TM appears normal.   Eyes: Conjunctivae are normal.  Neck: Neck supple.  Cardiovascular: Normal rate, regular rhythm and normal heart sounds.   Pulmonary/Chest: Breath sounds normal.  Lymphadenopathy:    She has no cervical adenopathy.  Neurological: She is  alert and oriented to person, place, and time.  Skin: No rash noted. She is not diaphoretic.    ED Course  Procedures (including critical care time)  Labs Reviewed - No data to display No results found.   1. External otitis of right ear       MDM  Treated with ear irrigation here. Prescribed ciprodex otic. Supportive care and red flags that should prompt her return to medical attention discussed with patient and provided in writing.         Sharin Grave, MD 10/31/12 0300

## 2012-11-14 ENCOUNTER — Encounter: Payer: Self-pay | Admitting: Endocrinology

## 2013-01-02 ENCOUNTER — Other Ambulatory Visit: Payer: Self-pay | Admitting: *Deleted

## 2013-01-02 MED ORDER — LISINOPRIL-HYDROCHLOROTHIAZIDE 10-12.5 MG PO TABS: ORAL_TABLET | ORAL | Status: DC

## 2013-02-13 LAB — HM DIABETES EYE EXAM

## 2013-02-28 ENCOUNTER — Other Ambulatory Visit: Payer: Self-pay

## 2013-02-28 MED ORDER — ROSUVASTATIN CALCIUM 40 MG PO TABS: 40.0000 mg | ORAL_TABLET | Freq: Every day | ORAL | Status: DC

## 2013-03-06 ENCOUNTER — Encounter: Payer: Self-pay | Admitting: Endocrinology

## 2013-03-09 ENCOUNTER — Other Ambulatory Visit: Payer: Self-pay | Admitting: *Deleted

## 2013-03-09 MED ORDER — METFORMIN HCL 1000 MG PO TABS: ORAL_TABLET | ORAL | Status: DC

## 2013-03-23 ENCOUNTER — Other Ambulatory Visit (INDEPENDENT_AMBULATORY_CARE_PROVIDER_SITE_OTHER): Payer: Federal, State, Local not specified - PPO

## 2013-03-23 ENCOUNTER — Telehealth: Payer: Self-pay | Admitting: Endocrinology

## 2013-03-23 ENCOUNTER — Other Ambulatory Visit: Payer: Self-pay | Admitting: *Deleted

## 2013-03-23 DIAGNOSIS — Z0000 Encounter for general adult medical examination without abnormal findings: Secondary | ICD-10-CM

## 2013-03-23 LAB — CBC
MCHC: 33.9 g/dL (ref 30.0–36.0)
Platelets: 201 10*3/uL (ref 150.0–400.0)
RDW: 14.5 % (ref 11.5–14.6)

## 2013-03-23 LAB — HEPATIC FUNCTION PANEL
Alkaline Phosphatase: 57 U/L (ref 39–117)
Bilirubin, Direct: 0.1 mg/dL (ref 0.0–0.3)
Total Protein: 6.5 g/dL (ref 6.0–8.3)

## 2013-03-23 LAB — BASIC METABOLIC PANEL
BUN: 12 mg/dL (ref 6–23)
CO2: 31 mEq/L (ref 19–32)
Chloride: 105 mEq/L (ref 96–112)
Creatinine, Ser: 0.8 mg/dL (ref 0.4–1.2)

## 2013-03-23 LAB — MICROALBUMIN / CREATININE URINE RATIO
Creatinine,U: 147.5 mg/dL
Microalb Creat Ratio: 1.9 mg/g (ref 0.0–30.0)

## 2013-03-23 NOTE — Telephone Encounter (Signed)
Rec'd from [facility] forward 7 pages to Dr.Ellison

## 2013-03-30 ENCOUNTER — Telehealth: Payer: Self-pay | Admitting: Endocrinology

## 2013-03-30 ENCOUNTER — Ambulatory Visit (INDEPENDENT_AMBULATORY_CARE_PROVIDER_SITE_OTHER): Payer: Federal, State, Local not specified - PPO | Admitting: Endocrinology

## 2013-03-30 ENCOUNTER — Encounter: Payer: Self-pay | Admitting: Endocrinology

## 2013-03-30 VITALS — BP 110/72 | HR 64 | Temp 98.0°F | Resp 16

## 2013-03-30 DIAGNOSIS — I6523 Occlusion and stenosis of bilateral carotid arteries: Secondary | ICD-10-CM

## 2013-03-30 DIAGNOSIS — E119 Type 2 diabetes mellitus without complications: Secondary | ICD-10-CM

## 2013-03-30 DIAGNOSIS — Z23 Encounter for immunization: Secondary | ICD-10-CM

## 2013-03-30 DIAGNOSIS — I658 Occlusion and stenosis of other precerebral arteries: Secondary | ICD-10-CM

## 2013-03-30 DIAGNOSIS — M810 Age-related osteoporosis without current pathological fracture: Secondary | ICD-10-CM

## 2013-03-30 DIAGNOSIS — I6529 Occlusion and stenosis of unspecified carotid artery: Secondary | ICD-10-CM

## 2013-03-30 LAB — EKG 12-LEAD

## 2013-03-30 MED ORDER — SITAGLIPTIN PHOSPHATE 100 MG PO TABS: 100.0000 mg | ORAL_TABLET | Freq: Every day | ORAL | Status: DC

## 2013-03-30 NOTE — Progress Notes (Signed)
Subjective:    Patient ID: Vicki Price, female    DOB: 04-May-1951, 62 y.o.   MRN: [id number]  HPI Pt is here for regular wellness examination, and is feeling pretty well in general, and says chronic med probs are stable, except as noted below Past Medical History  Diagnosis Date  . HYPOTHYROIDISM, POST-RADIATION 03/07/2008  . DIABETES MELLITUS, TYPE II 12/24/2006    2004  . HYPERCHOLESTEROLEMIA 09/03/2008  . HYPERTENSION 09/03/2008  . SMOKER 03/07/2008  . CAROTID ARTERY STENOSIS, BILATERAL 03/07/2008    09/2003  . Unspecified disorder of liver 09/03/2008  . OVARIAN CYST, RIGHT 03/07/2008  . OSTEOPOROSIS 12/24/2006  . COLONIC POLYPS, HX OF 12/24/2006  . CHEST PAIN UNSPECIFIED 03/10/2009  . Hyperthyroidism 2004    Past Surgical History  Procedure Laterality Date  . Btl  1989  . I-131 therapy  06/26/2002  . Esophagogastroduodenoscopy  07/31/1982  . Gated spect wall motion stress cardiolite  12/04/2003  . Carotid duplex  09/06/2006    History   Social History  . Marital Status: Married    Spouse Name: N/A    Number of Children: N/A  . Years of Education: N/A   Occupational History  . Homemaker    Social History Main Topics  . Smoking status: Current Every Day Smoker  . Smokeless tobacco: Not on file  . Alcohol Use: Yes     Comment: occasionally  . Drug Use: Not on file  . Sexual Activity: Yes    Birth Control/ Protection: Surgical     Comment: BTL   Other Topics Concern  . Not on file   Social History Narrative   Does not work outside the home    Current Outpatient Prescriptions on File Prior to Visit  Medication Sig Dispense Refill  . aspirin 81 MG tablet Take 81 mg by mouth daily.        . bromocriptine (PARLODEL) 2.5 MG tablet Take 1 tablet (2.5 mg total) by mouth at bedtime.  30 tablet  11  . glucose blood (FREESTYLE LITE) test strip Use as instructed       . levothyroxine (SYNTHROID, LEVOTHROID) 100 MCG tablet Take 1 tablet (100 mcg total) by mouth daily  before breakfast.  30 tablet  5  . lisinopril-hydrochlorothiazide (PRINZIDE,ZESTORETIC) 10-12.5 MG per tablet TAKE ONE-HALF TABLET BY MOUTH EVERY DAY  45 tablet  1  . metFORMIN (GLUCOPHAGE) 1000 MG tablet TAKE TWO TABLETS BY MOUTH EVERY DAY WITH FOOD  60 tablet  4  . rosuvastatin (CRESTOR) 40 MG tablet Take 1 tablet (40 mg total) by mouth daily.  30 tablet  4   No current facility-administered medications on file prior to visit.    No Known Allergies  Family History  Problem Relation Age of Onset  . Cancer Neg Hx   . Diabetes Mother   . Hypertension Brother     BP 110/72  Pulse 64  Temp(Src) 98 F (36.7 C) (Oral)  Resp 16     Review of Systems  Constitutional: Negative for fever.  HENT: Negative for hearing loss.   Eyes: Negative for visual disturbance.  Respiratory: Negative for shortness of breath.   Cardiovascular: Negative for chest pain.  Gastrointestinal: Negative for anal bleeding.  Endocrine: Negative for cold intolerance.  Genitourinary: Negative for hematuria.  Musculoskeletal: Negative for back pain.  Skin: Negative for rash.  Allergic/Immunologic: Negative for environmental allergies.  Hematological: Does not bruise/bleed easily.  Psychiatric/Behavioral: Negative for dysphoric mood.       Objective:  Physical Exam VS: see vs page GEN: no distress HEAD: head: no deformity eyes: no periorbital swelling, no proptosis external nose and ears are normal mouth: no lesion seen NECK: supple, thyroid is not enlarged CHEST WALL: no deformity LUNGS:  Clear to auscultation BREASTS:  No mass.  No d/c CV: reg rate and rhythm, no murmur ABD: abdomen is soft, nontender.  no hepatosplenomegaly.  not distended.  no hernia GENITALIA:  Normal external female.  Normal bimanual exam RECTAL: normal external and internal exam.  heme neg MUSCULOSKELETAL: muscle bulk and strength are grossly normal.  no obvious joint swelling.  gait is normal and steady EXTEMITIES: no  deformity.  no ulcer on the feet.  feet are of normal color and temp.  no edema PULSES: dorsalis pedis intact bilat.  no carotid bruit NEURO:  cn 2-12 grossly intact.   readily moves all 4's.  sensation is intact to touch on the feet SKIN:  Normal texture and temperature.  No rash or suspicious lesion is visible.   NODES:  None palpable at the neck PSYCH: alert, oriented x3.  Does not appear anxious nor depressed.    i reviewed electrocardiogram: no change    Assessment & Plan:  Wellness visit today, with problems stable, except as noted. we discussed code status.  pt requests full code, but would not want to be started or maintained on artificial life-support measures if there was not a reasonable chance of recovery     SEPARATE EVALUATION FOLLOWS--EACH PROBLEM HERE IS NEW, NOT RESPONDING TO TREATMENT, OR POSES SIGNIFICANT RISK TO THE PATIENT'S HEALTH: HISTORY OF THE PRESENT ILLNESS: Pt returns for f/u of type 2 DM (dx'ed 1999; she has mild if any neuropathy of the lower extremities, but she has associated PAD) Pt continues to smoke.  She wants to quit.  She smokes 1/2 ppd. PAST MEDICAL HISTORY reviewed and up to date today REVIEW OF SYSTEMS: denies hypoglycemia and cough PHYSICAL EXAMINATION: VITAL SIGNS:  See vs page GENERAL: no distress LAB/XRAY RESULTS: Lab Results  Component Value Date   HGBA1C 7.1* 03/23/2013  IMPRESSION: DM: she needs increased rx, if it can be done with a regimen that avoids or minimizes hypoglycemia. smoking: she refuses chantix.  We discussed quitting PLAN: See instruction page We discussed addition of januvia--pt refuses. Pt refuses chantix.  She will buy nicotine gum instead.

## 2013-03-30 NOTE — Patient Instructions (Addendum)
please consider these measures for your health:  minimize alcohol.  do not use tobacco products.  have a colonoscopy at least every 10 years from age 61.  Women should have an annual mammogram from age 69.  keep firearms safely stored.  always use seat belts.  have working smoke alarms in your home.  see an eye doctor and dentist regularly.  never drive under the influence of alcohol or drugs (including prescription drugs).   Please come back for a follow-up appointment in 3 months.

## 2013-03-30 NOTE — Telephone Encounter (Signed)
Please call [facility] and schedule

## 2013-04-13 NOTE — Telephone Encounter (Signed)
Please call and schedule a Bone Density at the [facility] Radiology office # [phone]. Call pt and let them know the date and time. Thank you.

## 2013-04-16 NOTE — Telephone Encounter (Signed)
Scheduled for 04/19/2013. Patients number was not working letter was mailed out on 04/13/2013.

## 2013-04-17 ENCOUNTER — Telehealth: Payer: Self-pay

## 2013-04-17 MED ORDER — ATORVASTATIN CALCIUM 80 MG PO TABS: 80.0000 mg | ORAL_TABLET | Freq: Every day | ORAL | Status: DC

## 2013-04-17 NOTE — Telephone Encounter (Signed)
Patient informed of changes and that new script has been sent.

## 2013-04-17 NOTE — Telephone Encounter (Signed)
Ok, i have sent a prescription to your pharmacy We'll recheck in the future.

## 2013-04-17 NOTE — Telephone Encounter (Signed)
Patient husband called inquiring about [employer] paper work about changing from [employer] to a generic due to the price. Also depending on what drug is chosen patient would like a 90 day supply rather than a 30 day supply.   Please advise,   Thanks!

## 2013-04-18 ENCOUNTER — Telehealth: Payer: Self-pay

## 2013-04-18 NOTE — Telephone Encounter (Signed)
Patients husband called having two questions. 1. The patient was given a 18 copay card for Crestor would that card still be good for 2015? If so patient would like to stay on the crestor. 2. The Lipitor copay was 113 and patient stated that she did not want to pay that.  Please advise,  Thanks!

## 2013-04-18 NOTE — Telephone Encounter (Signed)
Ok, fine to stay on the crestor

## 2013-04-18 NOTE — Telephone Encounter (Signed)
Patient informed. 

## 2013-04-19 ENCOUNTER — Ambulatory Visit (INDEPENDENT_AMBULATORY_CARE_PROVIDER_SITE_OTHER)
Admission: RE | Admit: 2013-04-19 | Discharge: 2013-04-19 | Disposition: A | Discharge status: Home / Self Care | Payer: Federal, State, Local not specified - PPO | Source: Ambulatory Visit | Attending: Endocrinology | Admitting: Endocrinology

## 2013-04-19 DIAGNOSIS — M810 Age-related osteoporosis without current pathological fracture: Secondary | ICD-10-CM

## 2013-06-14 ENCOUNTER — Encounter: Payer: Self-pay | Admitting: Endocrinology

## 2013-06-14 ENCOUNTER — Ambulatory Visit (INDEPENDENT_AMBULATORY_CARE_PROVIDER_SITE_OTHER): Payer: Federal, State, Local not specified - PPO | Admitting: Endocrinology

## 2013-06-14 VITALS — BP 110/62 | HR 93 | Temp 98.7°F | Ht 61.0 in | Wt 105.0 lb

## 2013-06-14 DIAGNOSIS — E119 Type 2 diabetes mellitus without complications: Secondary | ICD-10-CM

## 2013-06-14 LAB — HEMOGLOBIN A1C: Hgb A1c MFr Bld: 7.3 % — ABNORMAL HIGH (ref 4.6–6.5)

## 2013-06-14 NOTE — Progress Notes (Signed)
Subjective:    Patient ID: Vicki Price, female    DOB: 03/04/1951, 63 y.o.   MRN: [id number]  HPI Pt returns for f/u of type 2 DM (dx'ed 1999; she has mild if any neuropathy of the lower extremities, but she has associated PAD; she refuses [place] and actos, out of fear of side-effects).  no cbg record, but states cbg's are in the low-100's.   Past Medical History  Diagnosis Date  . HYPOTHYROIDISM, POST-RADIATION 03/07/2008  . DIABETES MELLITUS, TYPE II 12/24/2006    2004  . HYPERCHOLESTEROLEMIA 09/03/2008  . HYPERTENSION 09/03/2008  . SMOKER 03/07/2008  . CAROTID ARTERY STENOSIS, BILATERAL 03/07/2008    09/2003  . Unspecified disorder of liver 09/03/2008  . OVARIAN CYST, RIGHT 03/07/2008  . OSTEOPOROSIS 12/24/2006  . COLONIC POLYPS, HX OF 12/24/2006  . CHEST PAIN UNSPECIFIED 03/10/2009  . Hyperthyroidism 2004    Past Surgical History  Procedure Laterality Date  . Btl  1989  . I-131 therapy  06/26/2002  . Esophagogastroduodenoscopy  07/31/1982  . Gated spect wall motion stress cardiolite  12/04/2003  . Carotid duplex  09/06/2006    History   Social History  . Marital Status: Married    Spouse Name: N/A    Number of Children: N/A  . Years of Education: N/A   Occupational History  . Homemaker    Social History Main Topics  . Smoking status: Current Every Day Smoker  . Smokeless tobacco: Not on file  . Alcohol Use: Yes     Comment: occasionally  . Drug Use: Not on file  . Sexual Activity: Yes    Birth Control/ Protection: Surgical     Comment: BTL   Other Topics Concern  . Not on file   Social History Narrative   Does not work outside the home    Current Outpatient Prescriptions on File Prior to Visit  Medication Sig Dispense Refill  . aspirin 81 MG tablet Take 81 mg by mouth daily.        Marland Kitchen atorvastatin (LIPITOR) 80 MG tablet Take 1 tablet (80 mg total) by mouth daily.  90 tablet  3  . bromocriptine (PARLODEL) 2.5 MG tablet Take 1 tablet (2.5 mg total) by  mouth at bedtime.  30 tablet  11  . glucose blood (FREESTYLE LITE) test strip Use as instructed       . levothyroxine (SYNTHROID, LEVOTHROID) 100 MCG tablet Take 1 tablet (100 mcg total) by mouth daily before breakfast.  30 tablet  5  . lisinopril-hydrochlorothiazide (PRINZIDE,ZESTORETIC) 10-12.5 MG per tablet TAKE ONE-HALF TABLET BY MOUTH EVERY DAY  45 tablet  1  . metFORMIN (GLUCOPHAGE) 1000 MG tablet TAKE TWO TABLETS BY MOUTH EVERY DAY WITH FOOD  60 tablet  4   No current facility-administered medications on file prior to visit.    No Known Allergies  Family History  Problem Relation Age of Onset  . Cancer Neg Hx   . Diabetes Mother   . Hypertension Brother     BP 110/62  Pulse 93  Temp(Src) 98.7 F (37.1 C) (Oral)  Ht 5\' 1"  (1.549 m)  Wt 105 lb (47.628 kg)  BMI 19.85 kg/m2  SpO2 95%  Review of Systems Denies weight change and hypoglycemia.      Objective:   Physical Exam VITAL SIGNS:  See vs page.   GENERAL: no distress. Ext: no edema.   Lab Results  Component Value Date   HGBA1C 7.3* 06/14/2013      Assessment &  Plan:  DM: Needs increased rx, if it can be done with a regimen that avoids or minimizes hypoglycemia.  She refuses to add additional orals, or to add insulin.  i advised her of risks of refusal. PAD: in this setting, she should avoid hypoglycemia.

## 2013-06-14 NOTE — Patient Instructions (Addendum)
A diabetes blood test is requested for you today.  We'll contact you with results. Please come back for a follow-up appointment in 3 months.

## 2013-07-04 ENCOUNTER — Other Ambulatory Visit: Payer: Self-pay | Admitting: Endocrinology

## 2013-07-16 ENCOUNTER — Ambulatory Visit (INDEPENDENT_AMBULATORY_CARE_PROVIDER_SITE_OTHER): Payer: Federal, State, Local not specified - PPO | Admitting: Endocrinology

## 2013-07-16 ENCOUNTER — Encounter: Payer: Self-pay | Admitting: Endocrinology

## 2013-07-16 VITALS — BP 116/78 | HR 84 | Temp 98.9°F | Ht 61.0 in | Wt 103.0 lb

## 2013-07-16 DIAGNOSIS — E1159 Type 2 diabetes mellitus with other circulatory complications: Secondary | ICD-10-CM

## 2013-07-16 DIAGNOSIS — E1059 Type 1 diabetes mellitus with other circulatory complications: Secondary | ICD-10-CM

## 2013-07-16 DIAGNOSIS — E1169 Type 2 diabetes mellitus with other specified complication: Secondary | ICD-10-CM

## 2013-07-16 DIAGNOSIS — E119 Type 2 diabetes mellitus without complications: Secondary | ICD-10-CM

## 2013-07-16 MED ORDER — ROSUVASTATIN CALCIUM 20 MG PO TABS: 20.0000 mg | ORAL_TABLET | Freq: Every day | ORAL | Status: DC

## 2013-07-16 NOTE — Progress Notes (Signed)
Subjective:    Patient ID: Vicki Price, female    DOB: [birth year], 63 y.o.   MRN: [id number]  HPI Pt returns for f/u of type 2 DM (dx'ed 1999; she has mild if any neuropathy of the lower extremities, but she has associated PAD; she refuses [place] and actos, out of fear of side-effects).  no cbg record, but states cbg's are in the low-100's.  Past Medical History  Diagnosis Date  . HYPOTHYROIDISM, POST-RADIATION 03/07/2008  . DIABETES MELLITUS, TYPE II 12/24/2006    2004  . HYPERCHOLESTEROLEMIA 09/03/2008  . HYPERTENSION 09/03/2008  . SMOKER 03/07/2008  . CAROTID ARTERY STENOSIS, BILATERAL 03/07/2008    09/2003  . Unspecified disorder of liver 09/03/2008  . OVARIAN CYST, RIGHT 03/07/2008  . OSTEOPOROSIS 12/24/2006  . COLONIC POLYPS, HX OF 12/24/2006  . CHEST PAIN UNSPECIFIED 03/10/2009  . Hyperthyroidism 2004    Past Surgical History  Procedure Laterality Date  . Btl  1989  . I-131 therapy  06/26/2002  . Esophagogastroduodenoscopy  07/31/1982  . Gated spect wall motion stress cardiolite  12/04/2003  . Carotid duplex  09/06/2006    History   Social History  . Marital Status: Married    Spouse Name: N/A    Number of Children: N/A  . Years of Education: N/A   Occupational History  . Homemaker    Social History Main Topics  . Smoking status: Current Every Day Smoker  . Smokeless tobacco: Not on file  . Alcohol Use: Yes     Comment: occasionally  . Drug Use: Not on file  . Sexual Activity: Yes    Birth Control/ Protection: Surgical     Comment: BTL   Other Topics Concern  . Not on file   Social History Narrative   Does not work outside the home    Current Outpatient Prescriptions on File Prior to Visit  Medication Sig Dispense Refill  . aspirin 81 MG tablet Take 81 mg by mouth daily.        . bromocriptine (PARLODEL) 2.5 MG tablet TAKE ONE TABLET BY MOUTH ONCE DAILY AT BEDTIME  30 tablet  0  . glucose blood (FREESTYLE LITE) test strip Use as instructed         . levothyroxine (SYNTHROID, LEVOTHROID) 100 MCG tablet Take 1 tablet (100 mcg total) by mouth daily before breakfast.  30 tablet  5  . lisinopril-hydrochlorothiazide (PRINZIDE,ZESTORETIC) 10-12.5 MG per tablet TAKE ONE-HALF TABLET BY MOUTH ONCE DAILY ( NEEDS TO SCHEDULE FOLLOW UP WITH DR Everardo All)  45 tablet  0  . metFORMIN (GLUCOPHAGE) 1000 MG tablet TAKE TWO TABLETS BY MOUTH EVERY DAY WITH FOOD  60 tablet  4   No current facility-administered medications on file prior to visit.    No Known Allergies  Family History  Problem Relation Age of Onset  . Cancer Neg Hx   . Diabetes Mother   . Hypertension Brother     BP 116/78  Pulse 84  Temp(Src) 98.9 F (37.2 C) (Oral)  Ht 5\' 1"  (1.549 m)  Wt 103 lb (46.72 kg)  BMI 19.47 kg/m2  SpO2 94%  Review of Systems She denies hypoglycemia and weight change.      Objective:   Physical Exam VITAL SIGNS:  See vs page GENERAL: no distress   Lab Results  Component Value Date   HGBA1C 7.3* 06/14/2013      Assessment & Plan:  Type 2 DM: she needs increased rx, if it can be done with a  regimen that avoids or minimizes hypoglycemia.  We discussed rx options, and she will consider. PAD: in this setting, she should avoid hypoglycemia, so SU is not a good options here.

## 2013-07-16 NOTE — Patient Instructions (Addendum)
I think you should add additional medication for diabetes. Here is a list of possibilities.  Please consider and let me know.   Please come back for a follow-up appointment in 3 months.

## 2013-08-14 ENCOUNTER — Other Ambulatory Visit: Payer: Self-pay | Admitting: Endocrinology

## 2013-09-17 ENCOUNTER — Other Ambulatory Visit: Payer: Self-pay | Admitting: Endocrinology

## 2013-09-25 ENCOUNTER — Other Ambulatory Visit: Payer: Self-pay | Admitting: Endocrinology

## 2013-10-05 ENCOUNTER — Other Ambulatory Visit: Payer: Self-pay | Admitting: Endocrinology

## 2013-10-10 ENCOUNTER — Encounter: Payer: Self-pay | Admitting: Endocrinology

## 2013-10-22 ENCOUNTER — Other Ambulatory Visit: Payer: Self-pay | Admitting: Endocrinology

## 2013-10-29 ENCOUNTER — Other Ambulatory Visit: Payer: Self-pay | Admitting: Endocrinology

## 2013-11-01 ENCOUNTER — Other Ambulatory Visit: Payer: Self-pay | Admitting: Endocrinology

## 2013-11-21 ENCOUNTER — Encounter: Payer: Self-pay | Admitting: Endocrinology

## 2013-11-26 ENCOUNTER — Other Ambulatory Visit: Payer: Self-pay | Admitting: Endocrinology

## 2013-12-14 ENCOUNTER — Other Ambulatory Visit: Payer: Self-pay | Admitting: Endocrinology

## 2013-12-19 ENCOUNTER — Telehealth: Payer: Self-pay

## 2013-12-19 NOTE — Telephone Encounter (Signed)
Diabetic Bundle. Requested call back to schedule appointment with Dr. Everardo AllEllison.

## 2013-12-21 ENCOUNTER — Encounter: Payer: Self-pay | Admitting: Endocrinology

## 2013-12-21 ENCOUNTER — Ambulatory Visit (INDEPENDENT_AMBULATORY_CARE_PROVIDER_SITE_OTHER): Payer: Federal, State, Local not specified - PPO | Admitting: Endocrinology

## 2013-12-21 VITALS — BP 122/84 | HR 84 | Temp 98.8°F | Ht 61.0 in | Wt 102.0 lb

## 2013-12-21 DIAGNOSIS — E1159 Type 2 diabetes mellitus with other circulatory complications: Secondary | ICD-10-CM

## 2013-12-21 LAB — HEMOGLOBIN A1C: Hgb A1c MFr Bld: 6.7 % — ABNORMAL HIGH (ref 4.6–6.5)

## 2013-12-21 MED ORDER — REPAGLINIDE 0.5 MG PO TABS: 0.5000 mg | ORAL_TABLET | Freq: Three times a day (TID) | ORAL | Status: DC

## 2013-12-21 NOTE — Patient Instructions (Addendum)
blood tests are being requested for you today.  We'll contact you with results.  If it is high, we can add "repaglinide."   Please come back for a follow-up appointment in 3 months. check your blood sugar once a day.  vary the time of day when you check, between before the 3 meals, and at bedtime.  also check if you have symptoms of your blood sugar being too high or too low.  please keep a record of the readings and bring it to your next appointment here.  You can write it on any piece of paper.  please call us sooner if your blood sugar goes below 70, or if you have a lot of readings over 200.

## 2013-12-21 NOTE — Progress Notes (Signed)
Subjective:    Patient ID: Vicki Price, female    DOB: May 22, 1950, 63 y.o.   MRN: [id number]  HPI Pt returns for f/u of type 2 DM (dx'ed 1999; she has mild if any neuropathy of the lower extremities, but she has associated PAD; she refuses [place] and actos, out of fear of side-effects).  no cbg record, but states cbg's are in the low to mid-100's. pt states she feels well in general, except for mood swings, which she attributes to parlodel.   Past Medical History  Diagnosis Date  . HYPOTHYROIDISM, POST-RADIATION 03/07/2008  . DIABETES MELLITUS, TYPE II 12/24/2006    2004  . HYPERCHOLESTEROLEMIA 09/03/2008  . HYPERTENSION 09/03/2008  . SMOKER 03/07/2008  . CAROTID ARTERY STENOSIS, BILATERAL 03/07/2008    09/2003  . Unspecified disorder of liver 09/03/2008  . OVARIAN CYST, RIGHT 03/07/2008  . OSTEOPOROSIS 12/24/2006  . COLONIC POLYPS, HX OF 12/24/2006  . CHEST PAIN UNSPECIFIED 03/10/2009  . Hyperthyroidism 2004    Past Surgical History  Procedure Laterality Date  . Btl  1989  . I-131 therapy  06/26/2002  . Esophagogastroduodenoscopy  07/31/1982  . Gated spect wall motion stress cardiolite  12/04/2003  . Carotid duplex  09/06/2006    History   Social History  . Marital Status: Married    Spouse Name: N/A    Number of Children: N/A  . Years of Education: N/A   Occupational History  . Homemaker    Social History Main Topics  . Smoking status: Current Every Day Smoker  . Smokeless tobacco: Not on file  . Alcohol Use: Yes     Comment: occasionally  . Drug Use: Not on file  . Sexual Activity: Yes    Birth Control/ Protection: Surgical     Comment: BTL   Other Topics Concern  . Not on file   Social History Narrative   Does not work outside the home      No Known Allergies  Family History  Problem Relation Age of Onset  . Cancer Neg Hx   . Diabetes Mother   . Hypertension Brother     BP 122/84  Pulse 84  Temp(Src) 98.8 F (37.1 C) (Oral)  Ht 5\' 1"   (1.549 m)  Wt 102 lb (46.267 kg)  BMI 19.28 kg/m2  SpO2 98%   Review of Systems She denies hypoglycemia and weight change.    Objective:   Physical Exam VITAL SIGNS:  See vs page GENERAL: no distress Pulses: dorsalis pedis intact bilat.   Feet: no deformity. normal color and temp.  no edema Skin:  no ulcer on the feet.   Neuro: sensation is intact to touch on the feet  Lab Results  Component Value Date   HGBA1C 6.7* 12/21/2013      Assessment & Plan:  DM: well-controlled Mood swings: pt says this is due to parlodel. Smoker: i encouraged pt to quit. Noncompliance with cbg recording: I'll work around this as best I can.   Patient is advised the following: Patient Instructions  blood tests are being requested for you today.  We'll contact you with results.  If it is high, we can add "repaglinide."   Please come back for a follow-up appointment in 3 months. check your blood sugar once a day.  vary the time of day when you check, between before the 3 meals, and at bedtime.  also check if you have symptoms of your blood sugar being too high or too low.  please  keep a record of the readings and bring it to your next appointment here.  You can write it on any piece of paper.  please call us sooner if your blood sugar goes below 70, or if you have a lot of readings over 200.  please change bromocriptine to repaglinide. i have sent a prescription to your pharmacy

## 2013-12-27 ENCOUNTER — Other Ambulatory Visit: Payer: Self-pay | Admitting: Endocrinology

## 2014-01-15 ENCOUNTER — Other Ambulatory Visit: Payer: Self-pay | Admitting: Endocrinology

## 2014-01-15 ENCOUNTER — Telehealth: Payer: Self-pay | Admitting: Internal Medicine

## 2014-01-15 NOTE — Telephone Encounter (Signed)
Patient stated that he need refill of Linphril

## 2014-02-01 ENCOUNTER — Other Ambulatory Visit: Payer: Self-pay | Admitting: Endocrinology

## 2014-02-06 ENCOUNTER — Other Ambulatory Visit: Payer: Self-pay | Admitting: Endocrinology

## 2014-03-04 ENCOUNTER — Other Ambulatory Visit: Payer: Self-pay | Admitting: Endocrinology

## 2014-03-08 ENCOUNTER — Other Ambulatory Visit: Payer: Self-pay | Admitting: Endocrinology

## 2014-03-11 ENCOUNTER — Encounter: Payer: Self-pay | Admitting: Endocrinology

## 2014-03-26 ENCOUNTER — Encounter: Payer: Self-pay | Admitting: Endocrinology

## 2014-03-26 ENCOUNTER — Ambulatory Visit (INDEPENDENT_AMBULATORY_CARE_PROVIDER_SITE_OTHER): Payer: Federal, State, Local not specified - PPO | Admitting: Endocrinology

## 2014-03-26 VITALS — BP 118/70 | HR 100 | Temp 98.2°F | Ht 61.0 in | Wt 103.0 lb

## 2014-03-26 DIAGNOSIS — Z23 Encounter for immunization: Secondary | ICD-10-CM

## 2014-03-26 DIAGNOSIS — E1151 Type 2 diabetes mellitus with diabetic peripheral angiopathy without gangrene: Secondary | ICD-10-CM

## 2014-03-26 LAB — HEMOGLOBIN A1C: Hgb A1c MFr Bld: 6.8 % — ABNORMAL HIGH (ref 4.6–6.5)

## 2014-03-26 NOTE — Progress Notes (Signed)
Subjective:    Patient ID: Vicki Price, female    DOB: 02/26/1951, 63 y.o.   MRN: [id number]  HPI  Pt returns for f/u of diabetes mellitus: DM type:  Dx'ed:  PAD Complications:  Therapy: repaglinide DKA: never Severe hypoglycemia: never Pancreatitis: never Other: she refuses [place] and actos, out of fear of side-effects; she dis not tolerate parlodel (mood swings).  Interval history: no cbg record, but states cbg's are well-controlled.   pt states she feels well in general.  Past Medical History  Diagnosis Date  . HYPOTHYROIDISM, POST-RADIATION 03/07/2008  . DIABETES MELLITUS, TYPE II 12/24/2006    2004  . HYPERCHOLESTEROLEMIA 09/03/2008  . HYPERTENSION 09/03/2008  . SMOKER 03/07/2008  . CAROTID ARTERY STENOSIS, BILATERAL 03/07/2008    09/2003  . Unspecified disorder of liver 09/03/2008  . OVARIAN CYST, RIGHT 03/07/2008  . OSTEOPOROSIS 12/24/2006  . COLONIC POLYPS, HX OF 12/24/2006  . CHEST PAIN UNSPECIFIED 03/10/2009  . Hyperthyroidism 2004    Past Surgical History  Procedure Laterality Date  . Btl  1989  . I-131 therapy  06/26/2002  . Esophagogastroduodenoscopy  07/31/1982  . Gated spect wall motion stress cardiolite  12/04/2003  . Carotid duplex  09/06/2006    History   Social History  . Marital Status: Married    Spouse Name: N/A    Number of Children: N/A  . Years of Education: N/A   Occupational History  . Homemaker    Social History Main Topics  . Smoking status: Current Every Day Smoker  . Smokeless tobacco: Not on file  . Alcohol Use: Yes     Comment: occasionally  . Drug Use: Not on file  . Sexual Activity: Yes    Birth Control/ Protection: Surgical     Comment: BTL   Other Topics Concern  . Not on file   Social History Narrative   Does not work outside the home    Current Outpatient Prescriptions on File Prior to Visit  Medication Sig Dispense Refill  . aspirin 81 MG tablet Take 81 mg by mouth daily.      Marland Kitchen. glucose blood (FREESTYLE  LITE) test strip Use as instructed     . levothyroxine (SYNTHROID, LEVOTHROID) 100 MCG tablet TAKE ONE TABLET BY MOUTH ONCE DAILY BEFORE BREAKFAST 30 tablet 0  . lisinopril-hydrochlorothiazide (PRINZIDE,ZESTORETIC) 10-12.5 MG per tablet TAKE ONE-HALF TABLET BY MOUTH ONCE DAILY (NEEDS TO SCHEDULE FOLLOW UP WITH Terrie Grajales). 45 tablet 0  . metFORMIN (GLUCOPHAGE) 1000 MG tablet TAKE TWO TABLET BY MOUTH ONCE DAILY WITH FOOD ( ONE MONTH REFILL ONLY DUE FOR OFFICE VISIT THIS MONTH) 60 tablet 0  . repaglinide (PRANDIN) 0.5 MG tablet Take 1 tablet (0.5 mg total) by mouth 3 (three) times daily before meals. 90 tablet 11  . rosuvastatin (CRESTOR) 20 MG tablet Take 1 tablet (20 mg total) by mouth daily. 90 tablet 3   No current facility-administered medications on file prior to visit.    No Known Allergies  Family History  Problem Relation Age of Onset  . Cancer Neg Hx   . Diabetes Mother   . Hypertension Brother     BP 118/70 mmHg  Pulse 100  Temp(Src) 98.2 F (36.8 C) (Oral)  Ht 5\' 1"  (1.549 m)  Wt 103 lb (46.72 kg)  BMI 19.47 kg/m2  SpO2 97%  Review of Systems She denies hypoglycemia and weight change.     Objective:   Physical Exam VITAL SIGNS:  See vs page GENERAL: no distress Pulses:  dorsalis pedis intact bilat.   Feet: no deformity.  no edema Skin:  no ulcer on the feet.  normal color and temp. Neuro: sensation is intact to touch on the feet   Lab Results  Component Value Date   HGBA1C 6.8* 03/26/2014       Assessment & Plan:  DM: well-controlled   Patient is advised the following: Patient Instructions  blood tests are being requested for you today.  We'll contact you with results.  Please come back for a regular physical appointment in 3 months.  check your blood sugar once a day.  vary the time of day when you check, between before the 3 meals, and at bedtime.  also check if you have symptoms of your blood sugar being too high or too low.  please keep a record of the  readings and bring it to your next appointment here.  You can write it on any piece of paper.  please call us sooner if your blood sugar goes below 70, or if you have a lot of readings over 200.

## 2014-03-26 NOTE — Patient Instructions (Addendum)
blood tests are being requested for you today.  We'll contact you with results.  Please come back for a regular physical appointment in 3 months.   check your blood sugar once a day.  vary the time of day when you check, between before the 3 meals, and at bedtime.  also check if you have symptoms of your blood sugar being too high or too low.  please keep a record of the readings and bring it to your next appointment here.  You can write it on any piece of paper.  please call us sooner if your blood sugar goes below 70, or if you have a lot of readings over 200.   

## 2014-04-05 ENCOUNTER — Other Ambulatory Visit: Payer: Self-pay | Admitting: Endocrinology

## 2014-04-11 ENCOUNTER — Other Ambulatory Visit: Payer: Self-pay | Admitting: Endocrinology

## 2014-04-18 ENCOUNTER — Other Ambulatory Visit: Payer: Self-pay | Admitting: Endocrinology

## 2014-05-08 ENCOUNTER — Other Ambulatory Visit: Payer: Self-pay | Admitting: Endocrinology

## 2014-05-13 ENCOUNTER — Other Ambulatory Visit: Payer: Self-pay | Admitting: Endocrinology

## 2014-06-26 ENCOUNTER — Ambulatory Visit (INDEPENDENT_AMBULATORY_CARE_PROVIDER_SITE_OTHER): Payer: Federal, State, Local not specified - PPO | Admitting: Endocrinology

## 2014-06-26 ENCOUNTER — Encounter: Payer: Self-pay | Admitting: Endocrinology

## 2014-06-26 VITALS — BP 108/70 | HR 87 | Temp 98.0°F | Ht 61.0 in | Wt 102.0 lb

## 2014-06-26 DIAGNOSIS — M810 Age-related osteoporosis without current pathological fracture: Secondary | ICD-10-CM

## 2014-06-26 DIAGNOSIS — E890 Postprocedural hypothyroidism: Secondary | ICD-10-CM

## 2014-06-26 DIAGNOSIS — Z0189 Encounter for other specified special examinations: Secondary | ICD-10-CM

## 2014-06-26 DIAGNOSIS — E1151 Type 2 diabetes mellitus with diabetic peripheral angiopathy without gangrene: Secondary | ICD-10-CM

## 2014-06-26 DIAGNOSIS — Z0000 Encounter for general adult medical examination without abnormal findings: Secondary | ICD-10-CM

## 2014-06-26 LAB — HEPATIC FUNCTION PANEL
ALBUMIN: 4.6 g/dL (ref 3.5–5.2)
ALK PHOS: 65 U/L (ref 39–117)
ALT: 17 U/L (ref 0–35)
AST: 18 U/L (ref 0–37)
Bilirubin, Direct: 0.1 mg/dL (ref 0.0–0.3)
TOTAL PROTEIN: 7.3 g/dL (ref 6.0–8.3)
Total Bilirubin: 0.6 mg/dL (ref 0.2–1.2)

## 2014-06-26 LAB — BASIC METABOLIC PANEL
BUN: 13 mg/dL (ref 6–23)
CHLORIDE: 104 meq/L (ref 96–112)
CO2: 28 mEq/L (ref 19–32)
Calcium: 10.4 mg/dL (ref 8.4–10.5)
Creatinine, Ser: 0.76 mg/dL (ref 0.40–1.20)
GFR: 99.08 mL/min (ref >60.00)
Glucose, Bld: 123 mg/dL — ABNORMAL HIGH (ref 70–99)
Potassium: 4.5 mEq/L (ref 3.5–5.1)
Sodium: 141 mEq/L (ref 135–145)

## 2014-06-26 LAB — CBC WITH DIFFERENTIAL/PLATELET
Basophils Absolute: 0 10*3/uL (ref 0.0–0.1)
Basophils Relative: 0.4 % (ref 0.0–3.0)
EOS PCT: 0.3 % (ref 0.0–5.0)
Eosinophils Absolute: 0 10*3/uL (ref 0.0–0.7)
HCT: 46.9 % — ABNORMAL HIGH (ref 36.0–46.0)
Hemoglobin: 15.9 g/dL — ABNORMAL HIGH (ref 12.0–15.0)
LYMPHS PCT: 19.3 % (ref 12.0–46.0)
Lymphs Abs: 1.3 10*3/uL (ref 0.7–4.0)
MCHC: 33.8 g/dL (ref 30.0–36.0)
MCV: 87.6 fl (ref 78.0–100.0)
MONOS PCT: 6 % (ref 3.0–12.0)
Monocytes Absolute: 0.4 10*3/uL (ref 0.1–1.0)
NEUTROS ABS: 5 10*3/uL (ref 1.4–7.7)
NEUTROS PCT: 74 % (ref 43.0–77.0)
Platelets: 225 10*3/uL (ref 150.0–400.0)
RBC: 5.35 Mil/uL — ABNORMAL HIGH (ref 3.87–5.11)
RDW: 14.9 % (ref 11.5–15.5)
WBC: 6.7 10*3/uL (ref 4.0–10.5)

## 2014-06-26 LAB — LIPID PANEL
Cholesterol: 174 mg/dL (ref 0–200)
HDL: 75.4 mg/dL (ref >39.00)
LDL Cholesterol: 79 mg/dL (ref 0–99)
NONHDL: 98.6
Total CHOL/HDL Ratio: 2
Triglycerides: 96 mg/dL (ref 0.0–149.0)
VLDL: 19.2 mg/dL (ref 0.0–40.0)

## 2014-06-26 LAB — URINALYSIS, ROUTINE W REFLEX MICROSCOPIC
Bilirubin Urine: NEGATIVE
Hgb urine dipstick: NEGATIVE
Ketones, ur: NEGATIVE
Leukocytes, UA: NEGATIVE
Nitrite: NEGATIVE
SPECIFIC GRAVITY, URINE: 1.01 (ref 1.000–1.030)
TOTAL PROTEIN, URINE-UPE24: NEGATIVE
URINE GLUCOSE: NEGATIVE
Urobilinogen, UA: 0.2 (ref 0.0–1.0)
pH: 5.5 (ref 5.0–8.0)

## 2014-06-26 LAB — MICROALBUMIN / CREATININE URINE RATIO
Creatinine,U: 38.1 mg/dL
Microalb Creat Ratio: 1.8 mg/g (ref 0.0–30.0)
Microalb, Ur: 0.7 mg/dL (ref 0.0–1.9)

## 2014-06-26 LAB — TSH: TSH: 1.75 u[IU]/mL (ref 0.35–4.50)

## 2014-06-26 LAB — HEMOGLOBIN A1C: Hgb A1c MFr Bld: 6.9 % — ABNORMAL HIGH (ref 4.6–6.5)

## 2014-06-26 MED ORDER — HYDROCORTISONE 1 % EX LOTN: 1.0000 "application " | TOPICAL_LOTION | Freq: Three times a day (TID) | CUTANEOUS | Status: DC

## 2014-06-26 NOTE — Progress Notes (Signed)
Subjective:    Patient ID: Vicki Price, female    DOB: 09/21/1950, 64 y.o.   MRN: [id number]  HPI Pt is here for regular wellness examination, and is feeling pretty well in general, and says chronic med probs are stable, except as noted below Past Medical History  Diagnosis Date  . HYPOTHYROIDISM, POST-RADIATION 03/07/2008  . DIABETES MELLITUS, TYPE II 12/24/2006    2004  . HYPERCHOLESTEROLEMIA 09/03/2008  . HYPERTENSION 09/03/2008  . SMOKER 03/07/2008  . CAROTID ARTERY STENOSIS, BILATERAL 03/07/2008    09/2003  . Unspecified disorder of liver 09/03/2008  . OVARIAN CYST, RIGHT 03/07/2008  . OSTEOPOROSIS 12/24/2006  . COLONIC POLYPS, HX OF 12/24/2006  . CHEST PAIN UNSPECIFIED 03/10/2009  . Hyperthyroidism 2004    Past Surgical History  Procedure Laterality Date  . Btl  1989  . I-131 therapy  06/26/2002  . Esophagogastroduodenoscopy  07/31/1982  . Gated spect wall motion stress cardiolite  12/04/2003  . Carotid duplex  09/06/2006    History   Social History  . Marital Status: Married    Spouse Name: N/A  . Number of Children: N/A  . Years of Education: N/A   Occupational History  . Homemaker    Social History Main Topics  . Smoking status: Current Every Day Smoker  . Smokeless tobacco: Not on file  . Alcohol Use: Yes     Comment: occasionally  . Drug Use: Not on file  . Sexual Activity: Yes    Birth Control/ Protection: Surgical     Comment: BTL   Other Topics Concern  . Not on file   Social History Narrative   Does not work outside the home    Current Outpatient Prescriptions on File Prior to Visit  Medication Sig Dispense Refill  . aspirin 81 MG tablet Take 81 mg by mouth daily.      Marland Kitchen. glucose blood (FREESTYLE LITE) test strip Use as instructed     . levothyroxine (SYNTHROID, LEVOTHROID) 100 MCG tablet TAKE ONE TABLET BY MOUTH ONCE DAILY BEFORE BREAKFAST 30 tablet 2  . lisinopril-hydrochlorothiazide (PRINZIDE,ZESTORETIC) 10-12.5 MG per tablet TAKE  ONE-HALF TABLET BY MOUTH ONCE DAILY (NEED  TO  SCHEDULE  FOLLOW  UP  WITH  Joshuwa Vecchio) 45 tablet 0  . metFORMIN (GLUCOPHAGE) 1000 MG tablet TAKE TWO TABLETS BY MOUTH ONCE DAILY WITH FOOD, ( ONE MONTH REFILL ONLY , DUE FOR OFFICE VISIT THIS MONTH) 60 tablet 0  . repaglinide (PRANDIN) 0.5 MG tablet Take 1 tablet (0.5 mg total) by mouth 3 (three) times daily before meals. 90 tablet 11  . rosuvastatin (CRESTOR) 20 MG tablet Take 1 tablet (20 mg total) by mouth daily. 90 tablet 3   No current facility-administered medications on file prior to visit.    No Known Allergies  Family History  Problem Relation Age of Onset  . Cancer Neg Hx   . Diabetes Mother   . Hypertension Brother     BP 108/70 mmHg  Pulse 87  Temp(Src) 98 F (36.7 C) (Oral)  Ht 5\' 1"  (1.549 m)  Wt 102 lb (46.267 kg)  BMI 19.28 kg/m2  SpO2 97%   Review of Systems  Constitutional: Negative for unexpected weight change.  HENT: Negative for hearing loss.   Eyes: Negative for visual disturbance.  Respiratory: Negative for shortness of breath.   Cardiovascular: Negative for chest pain.  Gastrointestinal: Negative for anal bleeding.  Endocrine: Negative for cold intolerance.  Genitourinary: Negative for hematuria.  Musculoskeletal: Negative for back pain.  Skin: Negative for rash.  Allergic/Immunologic: Negative for environmental allergies.  Neurological: Negative for numbness.  Hematological: Does not bruise/bleed easily.  Psychiatric/Behavioral: Negative for dysphoric mood.       Objective:   Physical Exam VS: see vs page GEN: no distress HEAD: head: no deformity eyes: no periorbital swelling, no proptosis external nose and ears are normal mouth: no lesion seen NECK: supple, thyroid is not enlarged CHEST WALL: no deformity LUNGS:  Clear to auscultation BREASTS: sees gyn CV: reg rate and rhythm, no murmur ABD: abdomen is soft, nontender.  no hepatosplenomegaly.  not distended.  no hernia GENITALIA/RECTAL:  sees gyn MUSCULOSKELETAL: muscle bulk and strength are grossly normal.  no obvious joint swelling.  gait is normal and steady EXTEMITIES: no deformity.  no ulcer on the feet.  feet are of normal color and temp.  no edema PULSES: dorsalis pedis intact bilat.  no carotid bruit NEURO:  cn 2-12 grossly intact.   readily moves all 4's.  sensation is intact to touch on the feet SKIN:  Normal texture and temperature.   NODES:  None palpable at the neck PSYCH: alert, well-oriented.  Does not appear anxious nor depressed.        Assessment & Plan:  Wellness visit today, with problems stable, except as noted.  She declines chantix.    SEPARATE EVALUATION FOLLOWS--EACH PROBLEM HERE IS NEW, NOT RESPONDING TO TREATMENT, OR POSES SIGNIFICANT RISK TO THE PATIENT'S HEALTH: HISTORY OF THE PRESENT ILLNESS:  Pt states 1 month of moderate itching of the scalp, but no assoc rash.   PAST MEDICAL HISTORY reviewed and up to date today REVIEW OF SYSTEMS: Denies fever PHYSICAL EXAMINATION: VITAL SIGNS:  See vs page GENERAL: no distress Scalp: no rash LAB/XRAY RESULTS: Lab Results  Component Value Date   WBC 6.7 06/26/2014   HGB 15.9* 06/26/2014   HCT 46.9* 06/26/2014   MCV 87.6 06/26/2014   PLT 225.0 06/26/2014  IMPRESSION: Mildly abnormal cbc, prob due to smoking.   Itching, new, prob due to dyshidrosis.  PLAN: i have sent a prescription to your pharmacy, for the scalp lotion.

## 2014-06-26 NOTE — Progress Notes (Signed)
we discussed code status.  pt requests full code, but would not want to be started or maintained on artificial life-support measures if there was not a reasonable chance of recovery 

## 2014-06-26 NOTE — Patient Instructions (Addendum)
please consider these measures for your health:  minimize alcohol.  do not use tobacco products.  have a colonoscopy at least every 10 years from age 63.  Women should have an annual mammogram from age 63.  keep firearms safely stored.  always use seat belts.  have working smoke alarms in your home.  see an eye doctor and dentist regularly.  never drive under the influence of alcohol or drugs (including prescription drugs).   it is critically important to prevent falling down (keep floor areas well-lit, dry, and free of loose objects.  If you have a cane, walker, or wheelchair, you should use it, even for short trips around the house.  Also, try not to rush).   blood tests are being requested for you today.  We'll let you know about the results.   i have sent a prescription to your pharmacy, for the scalp lotion.   Please come back for a follow-up appointment in 6 months.   Please try nicotine patch or gum, to help you quit smoking.

## 2014-06-27 LAB — PTH, INTACT AND CALCIUM
Calcium: 10.1 mg/dL (ref 8.4–10.5)
PTH: 45 pg/mL (ref 14–64)

## 2014-07-11 ENCOUNTER — Telehealth: Payer: Self-pay | Admitting: Endocrinology

## 2014-07-11 ENCOUNTER — Other Ambulatory Visit: Payer: Self-pay | Admitting: Endocrinology

## 2014-07-11 MED ORDER — LEVOTHYROXINE SODIUM 100 MCG PO TABS: ORAL_TABLET | ORAL | Status: DC

## 2014-07-11 MED ORDER — METFORMIN HCL 1000 MG PO TABS: ORAL_TABLET | ORAL | Status: DC

## 2014-07-11 MED ORDER — LISINOPRIL-HYDROCHLOROTHIAZIDE 10-12.5 MG PO TABS: ORAL_TABLET | ORAL | Status: DC

## 2014-07-11 NOTE — Telephone Encounter (Signed)
Pt needs refilled for rx for lisinopril, levothyroxin, and metfomin.

## 2014-07-11 NOTE — Telephone Encounter (Signed)
Rx sent to pharmacy   

## 2014-07-19 ENCOUNTER — Other Ambulatory Visit: Payer: Self-pay | Admitting: Endocrinology

## 2014-10-14 ENCOUNTER — Other Ambulatory Visit: Payer: Self-pay | Admitting: Endocrinology

## 2014-11-13 ENCOUNTER — Other Ambulatory Visit: Payer: Self-pay | Admitting: Endocrinology

## 2014-11-19 ENCOUNTER — Encounter: Payer: Self-pay | Admitting: Endocrinology

## 2014-12-13 ENCOUNTER — Other Ambulatory Visit: Payer: Self-pay | Admitting: Endocrinology

## 2014-12-25 ENCOUNTER — Ambulatory Visit: Payer: Federal, State, Local not specified - PPO | Admitting: Endocrinology

## 2014-12-30 ENCOUNTER — Ambulatory Visit (INDEPENDENT_AMBULATORY_CARE_PROVIDER_SITE_OTHER): Payer: Federal, State, Local not specified - PPO | Admitting: Endocrinology

## 2014-12-30 ENCOUNTER — Encounter: Payer: Self-pay | Admitting: Endocrinology

## 2014-12-30 VITALS — BP 108/80 | HR 80 | Temp 98.8°F | Ht 61.0 in | Wt 105.0 lb

## 2014-12-30 DIAGNOSIS — M810 Age-related osteoporosis without current pathological fracture: Secondary | ICD-10-CM | POA: Diagnosis not present

## 2014-12-30 DIAGNOSIS — I6529 Occlusion and stenosis of unspecified carotid artery: Secondary | ICD-10-CM | POA: Diagnosis not present

## 2014-12-30 DIAGNOSIS — E1151 Type 2 diabetes mellitus with diabetic peripheral angiopathy without gangrene: Secondary | ICD-10-CM | POA: Diagnosis not present

## 2014-12-30 LAB — POCT GLYCOSYLATED HEMOGLOBIN (HGB A1C): Hemoglobin A1C: 6.9

## 2014-12-30 NOTE — Patient Instructions (Addendum)
Please continue the same medications.  Please come back for a regular physical appointment in 6 months (must be after 06/27/15).   check your blood sugar once a day.  vary the time of day when you check, between before the 3 meals, and at bedtime.  also check if you have symptoms of your blood sugar being too high or too low.  please keep a record of the readings and bring it to your next appointment here.  You can write it on any piece of paper.  please call us sooner if your blood sugar goes below 70, or if you have a lot of readings over 200.   Let's recheck the carotid arteries.  It is an easy and painless test.   Please try the nicotine gum.  If necessary, put 3-4 pieces in your mouth at a time.

## 2014-12-30 NOTE — Progress Notes (Signed)
Subjective:    Patient ID: Vicki Price, female    DOB: [birth year], 64 y.o.   MRN: [id number]  HPI  The state of at least three ongoing medical problems is addressed today, with interval history of each noted here:  Pt returns for f/u of diabetes mellitus: DM type: 2 Dx'ed:  PAD Complications:  Therapy: 2 oral meds. DKA: never Severe hypoglycemia: never Pancreatitis: never Other: she refuses [place] and actos, out of fear of side-effects; she did not tolerate parlodel (mood swings).  Interval history: no cbg record, but states cbg's are well-controlled.   pt states she feels well in general.  Smoking: she says she did not like the way she felt on wellbutrin.  Pt also has h/o carotid stenosis: she denies headache. Past Medical History  Diagnosis Date  . HYPOTHYROIDISM, POST-RADIATION 03/07/2008  . DIABETES MELLITUS, TYPE II 12/24/2006    2004  . HYPERCHOLESTEROLEMIA 09/03/2008  . HYPERTENSION 09/03/2008  . SMOKER 03/07/2008  . CAROTID ARTERY STENOSIS, BILATERAL 03/07/2008    09/2003  . Unspecified disorder of liver 09/03/2008  . OVARIAN CYST, RIGHT 03/07/2008  . OSTEOPOROSIS 12/24/2006  . COLONIC POLYPS, HX OF 12/24/2006  . CHEST PAIN UNSPECIFIED 03/10/2009  . Hyperthyroidism 2004    Past Surgical History  Procedure Laterality Date  . Btl  1989  . I-131 therapy  06/26/2002  . Esophagogastroduodenoscopy  07/31/1982  . Gated spect wall motion stress cardiolite  12/04/2003  . Carotid duplex  09/06/2006    Social History   Social History  . Marital Status: Married    Spouse Name: N/A  . Number of Children: N/A  . Years of Education: N/A   Occupational History  . Homemaker    Social History Main Topics  . Smoking status: Current Every Day Smoker  . Smokeless tobacco: Not on file  . Alcohol Use: Yes     Comment: occasionally  . Drug Use: Not on file  . Sexual Activity: Yes    Birth Control/ Protection: Surgical     Comment: BTL   Other Topics Concern  . Not on  file   Social History Narrative   Does not work outside the home    Current Outpatient Prescriptions on File Prior to Visit  Medication Sig Dispense Refill  . aspirin 81 MG tablet Take 81 mg by mouth daily.      . CRESTOR 20 MG tablet TAKE ONE TABLET BY MOUTH ONCE DAILY 90 tablet 0  . glucose blood (FREESTYLE LITE) test strip Use as instructed     . hydrocortisone 1 % lotion Apply 1 application topically 3 (three) times daily. As needed for itching 118 mL 5  . levothyroxine (SYNTHROID, LEVOTHROID) 100 MCG tablet TAKE ONE TABLET BY MOUTH ONCE DAILY BEFORE  BREAKFAST 30 tablet 0  . lisinopril-hydrochlorothiazide (PRINZIDE,ZESTORETIC) 10-12.5 MG per tablet TAKE ONE-HALF TABLET BY MOUTH ONCE DAILY 45 tablet 2  . metFORMIN (GLUCOPHAGE) 1000 MG tablet TAKE TWO TABLETS BY MOUTH ONCE DAILY WITH FOOD 60 tablet 0  . repaglinide (PRANDIN) 0.5 MG tablet Take 1 tablet (0.5 mg total) by mouth 3 (three) times daily before meals. 90 tablet 11   No current facility-administered medications on file prior to visit.    No Known Allergies  Family History  Problem Relation Age of Onset  . Cancer Neg Hx   . Diabetes Mother   . Hypertension Brother     BP 108/80 mmHg  Pulse 80  Temp(Src) 98.8 F (37.1 C) (Oral)  Ht   (1.549 m)  Wt 105 lb (47.628 kg)  BMI 19.85 kg/m2  SpO2 98%     Review of Systems Denies cough and sob    Objective:   Physical Exam VITAL SIGNS:  See vs page GENERAL: no distress Pulses: dorsalis pedis intact bilat.   MSK: no deformity of the feet CV: no leg edema Skin:  no ulcer on the feet.  normal color and temp on the feet. Neuro: sensation is intact to touch on the feet.    A1c=6.9%    Assessment & Plan:  Smoking, persistent: she declines chantix DM: well-controlled Carotid stenosis, due for recheck  Patient is advised the following: Patient Instructions  Please continue the same medications.  Please come back for a regular physical appointment in 6  months (must be after 06/27/15).   check your blood sugar once a day.  vary the time of day when you check, between before the 3 meals, and at bedtime.  also check if you have symptoms of your blood sugar being too high or too low.  please keep a record of the readings and bring it to your next appointment here.  You can write it on any piece of paper.  please call us sooner if your blood sugar goes below 70, or if you have a lot of readings over 200.   Let's recheck the carotid arteries.  It is an easy and painless test.   Please try the nicotine gum.  If necessary, put 3-4 pieces in your mouth at a time.

## 2015-01-13 ENCOUNTER — Other Ambulatory Visit: Payer: Self-pay | Admitting: Endocrinology

## 2015-01-27 ENCOUNTER — Ambulatory Visit (HOSPITAL_COMMUNITY)
Admission: RE | Admit: 2015-01-27 | Discharge: 2015-01-27 | Disposition: A | Discharge status: Home / Self Care | Payer: Federal, State, Local not specified - PPO | Source: Ambulatory Visit | Attending: Cardiovascular Disease | Admitting: Cardiovascular Disease

## 2015-01-27 DIAGNOSIS — E785 Hyperlipidemia, unspecified: Secondary | ICD-10-CM | POA: Insufficient documentation

## 2015-01-27 DIAGNOSIS — I10 Essential (primary) hypertension: Secondary | ICD-10-CM | POA: Insufficient documentation

## 2015-01-27 DIAGNOSIS — I6523 Occlusion and stenosis of bilateral carotid arteries: Secondary | ICD-10-CM | POA: Diagnosis not present

## 2015-01-27 DIAGNOSIS — F17200 Nicotine dependence, unspecified, uncomplicated: Secondary | ICD-10-CM | POA: Insufficient documentation

## 2015-01-27 DIAGNOSIS — E119 Type 2 diabetes mellitus without complications: Secondary | ICD-10-CM | POA: Diagnosis not present

## 2015-01-27 DIAGNOSIS — I6529 Occlusion and stenosis of unspecified carotid artery: Secondary | ICD-10-CM | POA: Diagnosis not present

## 2015-02-13 ENCOUNTER — Other Ambulatory Visit: Payer: Self-pay | Admitting: Endocrinology

## 2015-02-20 ENCOUNTER — Other Ambulatory Visit: Payer: Self-pay | Admitting: Endocrinology

## 2015-03-17 ENCOUNTER — Other Ambulatory Visit: Payer: Self-pay | Admitting: Endocrinology

## 2015-04-07 ENCOUNTER — Other Ambulatory Visit: Payer: Self-pay | Admitting: Endocrinology

## 2015-04-18 ENCOUNTER — Other Ambulatory Visit: Payer: Self-pay | Admitting: Endocrinology

## 2015-05-21 ENCOUNTER — Other Ambulatory Visit: Payer: Self-pay | Admitting: Endocrinology

## 2015-06-02 ENCOUNTER — Other Ambulatory Visit: Payer: Self-pay | Admitting: Endocrinology

## 2015-06-23 ENCOUNTER — Other Ambulatory Visit: Payer: Self-pay | Admitting: Endocrinology

## 2015-07-02 ENCOUNTER — Ambulatory Visit (INDEPENDENT_AMBULATORY_CARE_PROVIDER_SITE_OTHER): Payer: Federal, State, Local not specified - PPO | Admitting: Endocrinology

## 2015-07-02 ENCOUNTER — Encounter: Payer: Self-pay | Admitting: Endocrinology

## 2015-07-02 VITALS — BP 114/80 | HR 97 | Temp 97.7°F | Ht 61.0 in | Wt 108.0 lb

## 2015-07-02 DIAGNOSIS — E7800 Pure hypercholesterolemia, unspecified: Secondary | ICD-10-CM

## 2015-07-02 DIAGNOSIS — E890 Postprocedural hypothyroidism: Secondary | ICD-10-CM | POA: Diagnosis not present

## 2015-07-02 DIAGNOSIS — I10 Essential (primary) hypertension: Secondary | ICD-10-CM

## 2015-07-02 DIAGNOSIS — E1151 Type 2 diabetes mellitus with diabetic peripheral angiopathy without gangrene: Secondary | ICD-10-CM | POA: Diagnosis not present

## 2015-07-02 DIAGNOSIS — F17200 Nicotine dependence, unspecified, uncomplicated: Secondary | ICD-10-CM

## 2015-07-02 DIAGNOSIS — M810 Age-related osteoporosis without current pathological fracture: Secondary | ICD-10-CM | POA: Diagnosis not present

## 2015-07-02 DIAGNOSIS — K7581 Nonalcoholic steatohepatitis (NASH): Secondary | ICD-10-CM

## 2015-07-02 DIAGNOSIS — E119 Type 2 diabetes mellitus without complications: Secondary | ICD-10-CM | POA: Insufficient documentation

## 2015-07-02 LAB — BASIC METABOLIC PANEL
BUN: 11 mg/dL (ref 6–23)
CHLORIDE: 104 meq/L (ref 96–112)
CO2: 30 mEq/L (ref 19–32)
Calcium: 9.8 mg/dL (ref 8.4–10.5)
Creatinine, Ser: 0.76 mg/dL (ref 0.40–1.20)
GFR: 98.75 mL/min (ref >60.00)
Glucose, Bld: 133 mg/dL — ABNORMAL HIGH (ref 70–99)
POTASSIUM: 4.5 meq/L (ref 3.5–5.1)
Sodium: 141 mEq/L (ref 135–145)

## 2015-07-02 LAB — CBC WITH DIFFERENTIAL/PLATELET
Basophils Absolute: 0 K/uL (ref 0.0–0.1)
Basophils Relative: 0.3 % (ref 0.0–3.0)
Eosinophils Absolute: 0 K/uL (ref 0.0–0.7)
Eosinophils Relative: 0.4 % (ref 0.0–5.0)
HCT: 46.7 % — ABNORMAL HIGH (ref 36.0–46.0)
Hemoglobin: 15.6 g/dL — ABNORMAL HIGH (ref 12.0–15.0)
Lymphocytes Relative: 19.4 % (ref 12.0–46.0)
Lymphs Abs: 1.4 K/uL (ref 0.7–4.0)
MCHC: 33.5 g/dL (ref 30.0–36.0)
MCV: 88.8 fl (ref 78.0–100.0)
Monocytes Absolute: 0.6 K/uL (ref 0.1–1.0)
Monocytes Relative: 7.7 % (ref 3.0–12.0)
Neutro Abs: 5.4 K/uL (ref 1.4–7.7)
Neutrophils Relative %: 72.2 % (ref 43.0–77.0)
Platelets: 233 K/uL (ref 150.0–400.0)
RBC: 5.26 Mil/uL — ABNORMAL HIGH (ref 3.87–5.11)
RDW: 14.3 % (ref 11.5–15.5)
WBC: 7.4 K/uL (ref 4.0–10.5)

## 2015-07-02 LAB — URINALYSIS, ROUTINE W REFLEX MICROSCOPIC
Bilirubin Urine: NEGATIVE
Hgb urine dipstick: NEGATIVE
KETONES UR: NEGATIVE
Leukocytes, UA: NEGATIVE
Nitrite: NEGATIVE
RBC / HPF: NONE SEEN (ref 0–?)
SPECIFIC GRAVITY, URINE: 1.015 (ref 1.000–1.030)
Total Protein, Urine: NEGATIVE
UROBILINOGEN UA: 0.2 (ref 0.0–1.0)
Urine Glucose: NEGATIVE
pH: 5.5 (ref 5.0–8.0)

## 2015-07-02 LAB — HEPATIC FUNCTION PANEL
ALT: 21 U/L (ref 0–35)
AST: 23 U/L (ref 0–37)
Albumin: 4.4 g/dL (ref 3.5–5.2)
Alkaline Phosphatase: 65 U/L (ref 39–117)
BILIRUBIN DIRECT: 0.1 mg/dL (ref 0.0–0.3)
BILIRUBIN TOTAL: 0.6 mg/dL (ref 0.2–1.2)
TOTAL PROTEIN: 6.8 g/dL (ref 6.0–8.3)

## 2015-07-02 LAB — LIPID PANEL
CHOLESTEROL: 166 mg/dL (ref 0–200)
HDL: 76.4 mg/dL (ref >39.00)
LDL CALC: 67 mg/dL (ref 0–99)
NonHDL: 89.75
TRIGLYCERIDES: 114 mg/dL (ref 0.0–149.0)
Total CHOL/HDL Ratio: 2
VLDL: 22.8 mg/dL (ref 0.0–40.0)

## 2015-07-02 LAB — MICROALBUMIN / CREATININE URINE RATIO
CREATININE, U: 96.6 mg/dL
MICROALB/CREAT RATIO: 0.8 mg/g (ref 0.0–30.0)
Microalb, Ur: 0.8 mg/dL (ref 0.0–1.9)

## 2015-07-02 LAB — POCT GLYCOSYLATED HEMOGLOBIN (HGB A1C): Hemoglobin A1C: 6.9

## 2015-07-02 LAB — TSH: TSH: 4.28 u[IU]/mL (ref 0.35–4.50)

## 2015-07-02 NOTE — Patient Instructions (Addendum)
blood tests are requested for you today.  We'll let you know about the results. Let's check a CT scan.  you will receive a phone call, about a day and time for an appointment. Please come back for a regular physical appointment in 3 months. please consider these measures for your health:  minimize alcohol.  do not use tobacco products.  have a colonoscopy at least every 10 years from age 64.  Women should have an annual mammogram from age 8.  keep firearms safely stored.  always use seat belts.  have working smoke alarms in your home.  see an eye doctor and dentist regularly.  never drive under the influence of alcohol or drugs (including prescription drugs).   it is critically important to prevent falling down (keep floor areas well-lit, dry, and free of loose objects.  If you have a cane, walker, or wheelchair, you should use it, even for short trips around the house.  Also, try not to rush).

## 2015-07-02 NOTE — Progress Notes (Signed)
we discussed code status.  pt requests full code, but would not want to be started or maintained on artificial life-support measures if there was not a reasonable chance of recovery 

## 2015-07-02 NOTE — Progress Notes (Signed)
Subjective:    Patient ID: Vicki Price, female    DOB: 22-Jan-1951, 65 y.o.   MRN: [id number]  HPI The state of at least three ongoing medical problems is addressed today, with interval history of each noted here:  Pt returns for f/u of diabetes mellitus:  DM type: 2 Dx'ed:  2007 Complications: PAD Therapy: 2 oral meds. DKA: never Severe hypoglycemia: never Pancreatitis: never Other: she refuses [place] and actos, out of fear of side-effects; she did not tolerate parlodel (mood swings).  Interval history: no cbg record, but states cbg's are well-controlled.   pt states she feels well in general.  She says she cannot remember to take prandin.  HTN: she denies cough Dyslipidemia: she denies chest pain Past Medical History  Diagnosis Date  . HYPOTHYROIDISM, POST-RADIATION 03/07/2008  . DIABETES MELLITUS, TYPE II 12/24/2006    2004  . HYPERCHOLESTEROLEMIA 09/03/2008  . HYPERTENSION 09/03/2008  . SMOKER 03/07/2008  . CAROTID ARTERY STENOSIS, BILATERAL 03/07/2008    09/2003  . Unspecified disorder of liver 09/03/2008  . OVARIAN CYST, RIGHT 03/07/2008  . OSTEOPOROSIS 12/24/2006  . COLONIC POLYPS, HX OF 12/24/2006  . CHEST PAIN UNSPECIFIED 03/10/2009  . Hyperthyroidism 2004    Past Surgical History  Procedure Laterality Date  . Btl  1989  . I-131 therapy  06/26/2002  . Esophagogastroduodenoscopy  07/31/1982  . Gated spect wall motion stress cardiolite  12/04/2003  . Carotid duplex  09/06/2006    Social History   Social History  . Marital Status: Married    Spouse Name: N/A  . Number of Children: N/A  . Years of Education: N/A   Occupational History  . Homemaker    Social History Main Topics  . Smoking status: Current Every Day Smoker  . Smokeless tobacco: Not on file  . Alcohol Use: Yes     Comment: occasionally  . Drug Use: Not on file  . Sexual Activity: Yes    Birth Control/ Protection: Surgical     Comment: BTL   Other Topics Concern  . Not on file    Social History Narrative   Does not work outside the home    Current Outpatient Prescriptions on File Prior to Visit  Medication Sig Dispense Refill  . aspirin 81 MG tablet Take 81 mg by mouth daily.      Marland Kitchen glucose blood (FREESTYLE LITE) test strip Use as instructed     . hydrocortisone 1 % lotion Apply 1 application topically 3 (three) times daily. As needed for itching 118 mL 5  . levothyroxine (SYNTHROID, LEVOTHROID) 100 MCG tablet TAKE ONE TABLET BY MOUTH ONCE DAILY BEFORE  BREAKFAST 30 tablet 0  . lisinopril-hydrochlorothiazide (PRINZIDE,ZESTORETIC) 10-12.5 MG tablet TAKE ONE-HALF TABLET BY MOUTH ONCE DAILY 45 tablet 0  . metFORMIN (GLUCOPHAGE) 1000 MG tablet TAKE TWO TABLETS BY MOUTH ONCE DAILY WITH FOOD 60 tablet 0  . rosuvastatin (CRESTOR) 20 MG tablet TAKE ONE TABLET BY MOUTH ONCE DAILY 90 tablet 0  . repaglinide (PRANDIN) 0.5 MG tablet Take 1 tablet (0.5 mg total) by mouth 3 (three) times daily before meals. (Patient not taking: Reported on 07/02/2015) 90 tablet 11   No current facility-administered medications on file prior to visit.    No Known Allergies  Family History  Problem Relation Age of Onset  . Cancer Neg Hx   . Diabetes Mother   . Hypertension Brother     BP 114/80 mmHg  Pulse 97  Temp(Src) 97.7 F (36.5 C) (Oral)  Ht  (1.549 m)  Wt 108 lb (48.988 kg)  BMI 20.42 kg/m2  SpO2 97%  Review of Systems She has gained a few lbs.  She denies hypoglycemia    Objective:   Physical Exam VITAL SIGNS:  See vs page GENERAL: no distress Pulses: dorsalis pedis intact bilat.   MSK: no deformity of the feet CV: no leg edema Skin:  no ulcer on the feet.  normal color and temp on the feet. Neuro: sensation is intact to touch on the feet  i personally reviewed electrocardiogram tracing (today): Indication: DM Impression: short PR  A1c=6.9% Lab Results  Component Value Date   CHOL 166 07/02/2015   HDL 76.40 07/02/2015   LDLCALC 67 07/02/2015   TRIG  114.0 07/02/2015   CHOLHDL 2 07/02/2015      Assessment & Plan:  DM: Needs increased rx, if it can be done with a regimen that avoids or minimizes hypoglycemia. She declines additional medication. HTN: well-controlled Dyslipidemia: well-controlled  Please continue the same medications   Subjective:   Patient here for Medicare annual wellness visit and management of other chronic and acute problems.     Risk factors: multiple med problems  Roster of Physicians Providing Medical Care to Patient:  See "snapshot"   Activities of Daily Living: In your present state of health, do you have any difficulty performing the following activities? (lives with husband):  Preparing food and eating?: No  Bathing yourself: No  Getting dressed: No  Using the toilet:No  Moving around from place to place: No  In the past year have you fallen or had a near fall?:No    Home Safety: Has smoke detector and wears seat belts. No firearms.  Diet and Exercise  Current exercise habits: pt says good Dietary issues discussed: pt reports a healthy diet   Depression Screen  Q1: Over the past two weeks, have you felt down, depressed or hopeless? no  Q2: Over the past two weeks, have you felt little interest or pleasure in doing things? no   The following portions of the patient's history were reviewed and updated as appropriate: allergies, current medications, past family history, past medical history, past social history, past surgical history and problem list.   Review of Systems  Denies hearing loss, and visual loss Objective:   Vision:  Sees opthalmologist Dr Emily Filbert Hearing: grossly normal Body mass index:  See vs page Msk: pt easily and quickly performs "get-up-and-go" from a sitting position.   Cognitive Impairment Assessment: cognition, memory and judgment appear normal.  remembers 3/3 at 5 minutes.  excellent recall.  can easily read and write a sentence.  alert and oriented x 3.     Assessment:   Medicare wellness utd on preventive parameters    Plan:   During the course of the visit the patient was educated and counseled about appropriate screening and preventive services including:       Fall prevention   Screening mammography  Bone densitometry screening: pt says GYN does Diabetes screening  Nutrition counseling   Vaccines / LABS Zostavax / Pneumococcal Vaccine  today   Patient Instructions (the written plan) was given to the patient.

## 2015-07-03 LAB — PTH, INTACT AND CALCIUM
Calcium: 9.5 mg/dL (ref 8.4–10.5)
PTH: 41 pg/mL (ref 14–64)

## 2015-07-07 ENCOUNTER — Other Ambulatory Visit: Payer: Self-pay | Admitting: Endocrinology

## 2015-07-21 ENCOUNTER — Other Ambulatory Visit: Payer: Self-pay | Admitting: Acute Care

## 2015-07-21 DIAGNOSIS — F17210 Nicotine dependence, cigarettes, uncomplicated: Secondary | ICD-10-CM

## 2015-07-22 ENCOUNTER — Other Ambulatory Visit: Payer: Self-pay | Admitting: Endocrinology

## 2015-07-29 ENCOUNTER — Ambulatory Visit (INDEPENDENT_AMBULATORY_CARE_PROVIDER_SITE_OTHER)
Admission: RE | Admit: 2015-07-29 | Discharge: 2015-07-29 | Disposition: A | Discharge status: Home / Self Care | Payer: Federal, State, Local not specified - PPO | Source: Ambulatory Visit | Attending: Acute Care | Admitting: Acute Care

## 2015-07-29 ENCOUNTER — Ambulatory Visit (INDEPENDENT_AMBULATORY_CARE_PROVIDER_SITE_OTHER): Payer: Federal, State, Local not specified - PPO | Admitting: Acute Care

## 2015-07-29 ENCOUNTER — Encounter: Payer: Self-pay | Admitting: Acute Care

## 2015-07-29 DIAGNOSIS — F17210 Nicotine dependence, cigarettes, uncomplicated: Secondary | ICD-10-CM

## 2015-07-29 DIAGNOSIS — F17200 Nicotine dependence, unspecified, uncomplicated: Secondary | ICD-10-CM

## 2015-07-29 NOTE — Progress Notes (Signed)
Shared Decision Making Visit Lung Cancer Screening Program [phone])   Eligibility:  Age 65 y.o.  Pack Years Smoking History Calculation 44 pack years (# packs/per year x # years smoked)  Recent History of coughing up blood  no  Unexplained weight loss? no ( >Than 15 pounds within the last 6 months )  Prior History Lung / other cancer no (Diagnosis within the last 5 years already requiring surveillance chest CT Scans).  Smoking Status Current Smoker  Former Smokers: Years since quit: NA current smoker  Quit Date: NA  Visit Components:  Discussion included one or more decision making aids. yes  Discussion included risk/benefits of screening. yes  Discussion included potential follow up diagnostic testing for abnormal scans. yes  Discussion included meaning and risk of over diagnosis. yes  Discussion included meaning and risk of False Positives. yes  Discussion included meaning of total radiation exposure. yes  Counseling Included:  Importance of adherence to annual lung cancer LDCT screening. yes  Impact of comorbidities on ability to participate in the program. yes  Ability and willingness to under diagnostic treatment. yes  Smoking Cessation Counseling:  Current Smokers:   Discussed importance of smoking cessation. yes  Information about tobacco cessation classes and interventions provided to patient. yes  Patient provided with "ticket" for LDCT Scan. yes  Symptomatic Patient. no  CounselingNA  Diagnosis Code: Tobacco Use Z72.0  Asymptomatic Patient yes  Counseling (Intermediate counseling: > three minutes counseling) B2841  Former Smokers:   Discussed the importance of maintaining cigarette abstinence. No  Diagnosis Code: Personal History of Nicotine Dependence. L24.401  Information about tobacco cessation classes and interventions provided to patient. Yes  Patient provided with "ticket" for LDCT Scan. yes  Written Order for Lung Cancer  Screening with LDCT placed in Epic. Yes (CT Chest Lung Cancer Screening Low Dose W/O CM) [id number] Z12.2-Screening of respiratory organs Z87.891-Personal history of nicotine dependence  I have spent 20 minutes of face to face time with Vicki Price discussing the risks and benefits of lung cancer screening. We viewed a power point together that explained in detail the above noted topics. We paused at intervals to allow for questions to be asked and answered to ensure understanding.We discussed that the single most powerful action that she can take to decrease her risk of developing lung cancer is to quit smoking. We discussed whether or not she is ready to commit to setting a quit date. She is currently not ready to set a quit date. We discussed options for tools to aid in quitting smoking including nicotine replacement therapy, non-nicotine medications, support groups, Quit Smart classes, and behavior modification. We discussed that often times setting smaller, more achievable goals, such as eliminating 1 cigarette a day for a week and then 2 cigarettes a day for a week can be helpful in slowly decreasing the number of cigarettes smoked. This allows for a sense of accomplishment as well as providing a clinical benefit. I gave Mr. Janee Morn the " Be Stronger Than Your Excuses" card with contact information for community resources, classes, free nicotine replacement therapy, and access to mobile apps, text messaging, and on-line smoking cessation help. I have also given her my card and contact information in the event she needs to contact me. We discussed the time and location of the scan, and that either Jerolyn Shin, CMA, or I will call with the results within 24-48 hours of receiving them. I have provided her with a copy of the power point we viewed  as a resource in the event they need reinforcement of the concepts we discussed today in the office. The patient verbalized understanding of all of  the above and  had no further questions upon leaving the office. They have my contact information in the event they have any further questions.   Vicki NgoSarah F Groce, NP  07/29/2015

## 2015-09-08 ENCOUNTER — Other Ambulatory Visit: Payer: Self-pay | Admitting: Endocrinology

## 2015-09-29 ENCOUNTER — Encounter: Payer: Federal, State, Local not specified - PPO | Admitting: Endocrinology

## 2015-10-03 ENCOUNTER — Other Ambulatory Visit: Payer: Self-pay | Admitting: Endocrinology

## 2015-10-21 ENCOUNTER — Other Ambulatory Visit: Payer: Self-pay | Admitting: Endocrinology

## 2015-11-14 ENCOUNTER — Encounter: Payer: Self-pay | Admitting: Endocrinology

## 2015-12-23 ENCOUNTER — Other Ambulatory Visit: Payer: Self-pay | Admitting: Endocrinology

## 2016-01-15 ENCOUNTER — Other Ambulatory Visit: Payer: Self-pay | Admitting: Endocrinology

## 2016-01-28 ENCOUNTER — Telehealth: Payer: Self-pay | Admitting: Endocrinology

## 2016-01-28 NOTE — Telephone Encounter (Signed)
PT said that she needs a [facility] of [facility] [place]  She stated that she is taking two a day and that her script only says one a day.

## 2016-01-29 ENCOUNTER — Other Ambulatory Visit: Payer: Self-pay | Admitting: Endocrinology

## 2016-01-29 MED ORDER — METFORMIN HCL 1000 MG PO TABS: 1000.0000 mg | ORAL_TABLET | Freq: Two times a day (BID) | ORAL | 0 refills | Status: DC

## 2016-01-29 NOTE — Telephone Encounter (Signed)
Patient stated medication change dosage should be two pills a day, metFORMIN (GLUCOPHAGE) 1000 MG tablet

## 2016-01-29 NOTE — Telephone Encounter (Signed)
See message and please advise if ok to refill. Current med list states 1 time per day.

## 2016-01-29 NOTE — Telephone Encounter (Signed)
Ok, I refilled x 1 Ov is due

## 2016-02-09 ENCOUNTER — Encounter: Payer: Self-pay | Admitting: Endocrinology

## 2016-02-09 ENCOUNTER — Ambulatory Visit (INDEPENDENT_AMBULATORY_CARE_PROVIDER_SITE_OTHER): Payer: Federal, State, Local not specified - PPO | Admitting: Endocrinology

## 2016-02-09 VITALS — BP 120/88 | HR 85 | Temp 98.8°F | Wt 106.0 lb

## 2016-02-09 DIAGNOSIS — E890 Postprocedural hypothyroidism: Secondary | ICD-10-CM

## 2016-02-09 DIAGNOSIS — E1151 Type 2 diabetes mellitus with diabetic peripheral angiopathy without gangrene: Secondary | ICD-10-CM

## 2016-02-09 DIAGNOSIS — Z119 Encounter for screening for infectious and parasitic diseases, unspecified: Secondary | ICD-10-CM | POA: Insufficient documentation

## 2016-02-09 DIAGNOSIS — Z23 Encounter for immunization: Secondary | ICD-10-CM

## 2016-02-09 LAB — HEMOGLOBIN A1C: Hgb A1c MFr Bld: 7.1 % — ABNORMAL HIGH (ref 4.6–6.5)

## 2016-02-09 LAB — TSH: TSH: 3.79 u[IU]/mL (ref 0.35–4.50)

## 2016-02-09 MED ORDER — AMOXICILLIN 250 MG PO CAPS: 250.0000 mg | ORAL_CAPSULE | Freq: Three times a day (TID) | ORAL | 0 refills | Status: DC

## 2016-02-09 MED ORDER — REPAGLINIDE 1 MG PO TABS: 1.0000 mg | ORAL_TABLET | Freq: Three times a day (TID) | ORAL | 11 refills | Status: DC

## 2016-02-09 NOTE — Patient Instructions (Addendum)
I have sent a prescription to your pharmacy, for an antibiotic pill.  blood tests are requested for you today.  We'll let you know about the results.  Please come back for an annual physical appointment in 4 months.

## 2016-02-09 NOTE — Progress Notes (Signed)
Subjective:    Patient ID: Vicki Price, female    DOB: 10/22/1950, 65 y.o.   MRN: [id number]  HPI Pt states few days of moderate pain at the throat, but no assoc fever.   Past Medical History:  Diagnosis Date  . CAROTID ARTERY STENOSIS, BILATERAL 03/07/2008   09/2003  . CHEST PAIN UNSPECIFIED 03/10/2009  . COLONIC POLYPS, HX OF 12/24/2006  . DIABETES MELLITUS, TYPE II 12/24/2006   2004  . HYPERCHOLESTEROLEMIA 09/03/2008  . HYPERTENSION 09/03/2008  . Hyperthyroidism 2004  . HYPOTHYROIDISM, POST-RADIATION 03/07/2008  . OSTEOPOROSIS 12/24/2006  . OVARIAN CYST, RIGHT 03/07/2008  . SMOKER 03/07/2008  . Unspecified disorder of liver 09/03/2008    Past Surgical History:  Procedure Laterality Date  . BTL  1989  . Carotid Duplex  09/06/2006  . ESOPHAGOGASTRODUODENOSCOPY  07/31/1982  . Gated Spect Wall Motion Stress Cardiolite  12/04/2003  . I-131 therapy  06/26/2002    Social History   Social History  . Marital status: Married    Spouse name: N/A  . Number of children: N/A  . Years of education: N/A   Occupational History  . Homemaker    Social History Main Topics  . Smoking status: Current Every Day Smoker    Packs/day: 0.80    Years: 44.00    Types: Cigarettes  . Smokeless tobacco: Not on file     Comment: Counseled to quit smoking immediately. Nicotine replacement therapy discussed and offered.  . Alcohol use 0.0 oz/week     Comment: occasionally  . Drug use: Unknown  . Sexual activity: Yes    Birth control/ protection: Surgical     Comment: BTL   Other Topics Concern  . Not on file   Social History Narrative   Does not work outside the home    Current Outpatient Prescriptions on File Prior to Visit  Medication Sig Dispense Refill  . aspirin 81 MG tablet Take 81 mg by mouth daily.      Marland Kitchen. glucose blood (FREESTYLE LITE) test strip Use as instructed     . hydrocortisone 1 % lotion Apply 1 application topically 3 (three) times daily. As needed for itching 118  mL 5  . levothyroxine (SYNTHROID, LEVOTHROID) 100 MCG tablet TAKE ONE TABLET BY MOUTH DAILY BEFORE BREAKFAST 90 tablet 0  . lisinopril-hydrochlorothiazide (PRINZIDE,ZESTORETIC) 10-12.5 MG tablet TAKE ONE-HALF TABLET BY MOUTH ONCE DAILY 45 tablet 1  . metFORMIN (GLUCOPHAGE) 1000 MG tablet Take 1 tablet (1,000 mg total) by mouth 2 (two) times daily with a meal. 60 tablet 0  . rosuvastatin (CRESTOR) 20 MG tablet TAKE ONE TABLET BY MOUTH ONCE DAILY 90 tablet 0   No current facility-administered medications on file prior to visit.     No Known Allergies  Family History  Problem Relation Age of Onset  . Cancer Neg Hx   . Diabetes Mother   . Hypertension Brother    BP 120/88 (BP Location: Left Arm, Patient Position: Sitting)   Pulse 85   Temp 98.8 F (37.1 C)   Wt 106 lb (48.1 kg)   SpO2 98%   BMI 20.03 kg/m   Review of Systems Denies earache and cough.    Objective:   Physical Exam VITAL SIGNS:  See vs page GENERAL: no distress head: no deformity  eyes: no periorbital swelling, no proptosis  external nose and ears are normal  mouth: no lesion seen Both eac's and tm's are normal LUNGS:  Clear to auscultation.    Lab Results  Component Value Date   TSH 3.79 02/09/2016      Assessment & Plan:  URI: new Hypothyroidism: well-replaced.  Please continue the same medication. Patient is advised the following: Patient Instructions  I have sent a prescription to your pharmacy, for an antibiotic pill.  blood tests are requested for you today.  We'll let you know about the results.  Please come back for an annual physical appointment in 4 months.

## 2016-02-10 LAB — HEPATITIS C ANTIBODY: HCV AB: NEGATIVE

## 2016-02-10 LAB — HIV ANTIBODY (ROUTINE TESTING W REFLEX): HIV: NONREACTIVE

## 2016-02-10 LAB — HEPATITIS B SURFACE ANTIGEN: Hepatitis B Surface Ag: NEGATIVE

## 2016-02-10 LAB — HEPATITIS B SURFACE ANTIBODY, QUANTITATIVE: Hepatitis B-Post: <5 m[IU]/mL

## 2016-03-01 ENCOUNTER — Other Ambulatory Visit: Payer: Self-pay | Admitting: Endocrinology

## 2016-03-04 ENCOUNTER — Ambulatory Visit (INDEPENDENT_AMBULATORY_CARE_PROVIDER_SITE_OTHER): Payer: Federal, State, Local not specified - PPO

## 2016-03-04 DIAGNOSIS — Z23 Encounter for immunization: Secondary | ICD-10-CM

## 2016-03-16 ENCOUNTER — Telehealth: Payer: Self-pay | Admitting: Acute Care

## 2016-03-16 DIAGNOSIS — F17210 Nicotine dependence, cigarettes, uncomplicated: Secondary | ICD-10-CM

## 2016-03-16 NOTE — Telephone Encounter (Signed)
This is documentation the phone call made to Mrs. Vicki Price on 08/01/2015. I called Vicki Price with the results of her low-dose CT screening exam that was done on 07/29/2015. I explained to her that her scan was read as a lung RADS category 2, indicating nodules that are benign in appearance and behavior. Recommendation per radiology was for continued annual screening with low-dose CT in 12 months. I explained to her that we will order and schedule scan for March 2018. We also discussed that the scan indicated coronary artery disease. The patient explained to me that she is currently taking Crestor and aspirin per her primary care physician. There was also additional notation of emphysema on the scan. The patient was already aware of this diagnosis. This Vicki Price verbalized understanding of the above and had no further questions at completion of the phone call.

## 2016-03-16 NOTE — Telephone Encounter (Signed)
please call patient: CT showed blockages in the heart arteries.  Please come in for ov to address.

## 2016-03-17 NOTE — Telephone Encounter (Signed)
Requested a call back from the patient to discuss.  

## 2016-03-18 ENCOUNTER — Telehealth: Payer: Self-pay | Admitting: Endocrinology

## 2016-03-18 DIAGNOSIS — I25810 Atherosclerosis of coronary artery bypass graft(s) without angina pectoris: Secondary | ICD-10-CM | POA: Insufficient documentation

## 2016-03-18 NOTE — Telephone Encounter (Signed)
I called pt.  We discussed.  I'll see you next week.

## 2016-03-18 NOTE — Telephone Encounter (Signed)
I contacted the patient and the patient's husband and and advised the referral has been placed. Appointment cancelled for next week. No further questions at this time.

## 2016-03-18 NOTE — Telephone Encounter (Signed)
Pt husband called and requests that Dr. Everardo AllEllison send a referral for Pt to a cardiologist.

## 2016-03-18 NOTE — Telephone Encounter (Signed)
I contacted the patient and advised of message. Patient requested a call back from the MD to discuss further. She stated she had a few questions she would like to discuss with him. Appointment scheduled for 03/22/2016.

## 2016-03-18 NOTE — Telephone Encounter (Signed)
Ok, I sent referral You can skip the appointment for next week.

## 2016-03-18 NOTE — Telephone Encounter (Signed)
See message and please advise, Thanks!  

## 2016-03-22 ENCOUNTER — Telehealth: Payer: Self-pay | Admitting: Endocrinology

## 2016-03-22 NOTE — Telephone Encounter (Signed)
Patient's husband called back and stated the patient has been scheduled with a cardiologist for 03/23/2016 and is coming to the office to sign the release of medical records today for her records to be sent.

## 2016-03-22 NOTE — Telephone Encounter (Signed)
Patient Husband stated no one has contacted him about wife referral or when it's been placed. Please advise

## 2016-03-23 ENCOUNTER — Ambulatory Visit: Payer: Federal, State, Local not specified - PPO | Admitting: Endocrinology

## 2016-03-24 ENCOUNTER — Observation Stay (HOSPITAL_COMMUNITY)
Admission: AD | Admit: 2016-03-24 | Discharge: 2016-03-25 | Disposition: A | Discharge status: Home / Self Care | Payer: Federal, State, Local not specified - PPO | Source: Ambulatory Visit | Attending: Cardiovascular Disease | Admitting: Cardiovascular Disease

## 2016-03-24 ENCOUNTER — Ambulatory Visit: Payer: Federal, State, Local not specified - PPO | Admitting: Cardiology

## 2016-03-24 DIAGNOSIS — Z7984 Long term (current) use of oral hypoglycemic drugs: Secondary | ICD-10-CM | POA: Insufficient documentation

## 2016-03-24 DIAGNOSIS — F17210 Nicotine dependence, cigarettes, uncomplicated: Secondary | ICD-10-CM | POA: Insufficient documentation

## 2016-03-24 DIAGNOSIS — R9439 Abnormal result of other cardiovascular function study: Secondary | ICD-10-CM | POA: Diagnosis present

## 2016-03-24 DIAGNOSIS — E119 Type 2 diabetes mellitus without complications: Secondary | ICD-10-CM

## 2016-03-24 DIAGNOSIS — E1151 Type 2 diabetes mellitus with diabetic peripheral angiopathy without gangrene: Secondary | ICD-10-CM | POA: Insufficient documentation

## 2016-03-24 DIAGNOSIS — Z7982 Long term (current) use of aspirin: Secondary | ICD-10-CM | POA: Insufficient documentation

## 2016-03-24 DIAGNOSIS — R072 Precordial pain: Secondary | ICD-10-CM | POA: Diagnosis present

## 2016-03-24 DIAGNOSIS — E0590 Thyrotoxicosis, unspecified without thyrotoxic crisis or storm: Secondary | ICD-10-CM | POA: Insufficient documentation

## 2016-03-24 DIAGNOSIS — E785 Hyperlipidemia, unspecified: Secondary | ICD-10-CM | POA: Insufficient documentation

## 2016-03-24 DIAGNOSIS — Z79899 Other long term (current) drug therapy: Secondary | ICD-10-CM | POA: Diagnosis not present

## 2016-03-24 DIAGNOSIS — I25110 Atherosclerotic heart disease of native coronary artery with unstable angina pectoris: Secondary | ICD-10-CM | POA: Diagnosis not present

## 2016-03-24 DIAGNOSIS — I10 Essential (primary) hypertension: Secondary | ICD-10-CM | POA: Insufficient documentation

## 2016-03-24 DIAGNOSIS — I2584 Coronary atherosclerosis due to calcified coronary lesion: Secondary | ICD-10-CM | POA: Insufficient documentation

## 2016-03-24 DIAGNOSIS — E7800 Pure hypercholesterolemia, unspecified: Secondary | ICD-10-CM | POA: Diagnosis present

## 2016-03-24 DIAGNOSIS — M810 Age-related osteoporosis without current pathological fracture: Secondary | ICD-10-CM | POA: Insufficient documentation

## 2016-03-24 DIAGNOSIS — E890 Postprocedural hypothyroidism: Secondary | ICD-10-CM | POA: Insufficient documentation

## 2016-03-24 DIAGNOSIS — F17200 Nicotine dependence, unspecified, uncomplicated: Secondary | ICD-10-CM | POA: Diagnosis present

## 2016-03-24 LAB — COMPREHENSIVE METABOLIC PANEL
ALBUMIN: 4.6 g/dL (ref 3.5–5.0)
ALT: 19 U/L (ref 14–54)
AST: 20 U/L (ref 15–41)
Alkaline Phosphatase: 64 U/L (ref 38–126)
Anion gap: 8 (ref 5–15)
BILIRUBIN TOTAL: 0.6 mg/dL (ref 0.3–1.2)
BUN: 11 mg/dL (ref 6–20)
CO2: 29 mmol/L (ref 22–32)
CREATININE: 0.88 mg/dL (ref 0.44–1.00)
Calcium: 10 mg/dL (ref 8.9–10.3)
Chloride: 102 mmol/L (ref 101–111)
GFR calc Af Amer: >60 mL/min (ref >60)
GFR calc non Af Amer: >60 mL/min (ref >60)
GLUCOSE: 145 mg/dL — AB (ref 65–99)
POTASSIUM: 3.6 mmol/L (ref 3.5–5.1)
Sodium: 139 mmol/L (ref 135–145)
TOTAL PROTEIN: 7.3 g/dL (ref 6.5–8.1)

## 2016-03-24 LAB — CBC WITH DIFFERENTIAL/PLATELET
Basophils Absolute: 0 10*3/uL (ref 0.0–0.1)
Basophils Relative: 0 %
EOS PCT: 1 %
Eosinophils Absolute: 0 10*3/uL (ref 0.0–0.7)
HEMATOCRIT: 46.9 % — AB (ref 36.0–46.0)
Hemoglobin: 16.2 g/dL — ABNORMAL HIGH (ref 12.0–15.0)
LYMPHS PCT: 33 %
Lymphs Abs: 2.1 10*3/uL (ref 0.7–4.0)
MCH: 30.2 pg (ref 26.0–34.0)
MCHC: 34.5 g/dL (ref 30.0–36.0)
MCV: 87.3 fL (ref 78.0–100.0)
MONO ABS: 0.5 10*3/uL (ref 0.1–1.0)
MONOS PCT: 8 %
NEUTROS ABS: 3.5 10*3/uL (ref 1.7–7.7)
Neutrophils Relative %: 58 %
Platelets: 225 10*3/uL (ref 150–400)
RBC: 5.37 MIL/uL — ABNORMAL HIGH (ref 3.87–5.11)
RDW: 13.9 % (ref 11.5–15.5)
WBC: 6.1 10*3/uL (ref 4.0–10.5)

## 2016-03-24 LAB — TROPONIN I: Troponin I: <0.03 ng/mL (ref <0.03)

## 2016-03-24 LAB — APTT: aPTT: 32 seconds (ref 24–36)

## 2016-03-24 LAB — PROTIME-INR
INR: 0.94
Prothrombin Time: 12.5 seconds (ref 11.4–15.2)

## 2016-03-24 LAB — GLUCOSE, CAPILLARY: Glucose-Capillary: 150 mg/dL — ABNORMAL HIGH (ref 65–99)

## 2016-03-24 MED ORDER — NITROGLYCERIN 0.4 MG SL SUBL: 0.4000 mg | SUBLINGUAL_TABLET | SUBLINGUAL | Status: DC | PRN

## 2016-03-24 MED ORDER — ACETAMINOPHEN 325 MG PO TABS: 650.0000 mg | ORAL_TABLET | ORAL | Status: DC | PRN

## 2016-03-24 MED ORDER — LISINOPRIL 5 MG PO TABS: 5.0000 mg | ORAL_TABLET | Freq: Every day | ORAL | Status: DC

## 2016-03-24 MED ORDER — ROSUVASTATIN CALCIUM 10 MG PO TABS: 20.0000 mg | ORAL_TABLET | Freq: Every day | ORAL | Status: DC

## 2016-03-24 MED ORDER — HEPARIN (PORCINE) IN NACL 100-0.45 UNIT/ML-% IJ SOLN
650.0000 [IU]/h | INTRAMUSCULAR | Status: DC
  Administered 2016-03-25: 650 [IU]/h via INTRAVENOUS
  Filled 2016-03-24: qty 250

## 2016-03-24 MED ORDER — ATORVASTATIN CALCIUM 40 MG PO TABS: 40.0000 mg | ORAL_TABLET | Freq: Every day | ORAL | Status: DC

## 2016-03-24 MED ORDER — ONDANSETRON HCL 4 MG/2ML IJ SOLN: 4.0000 mg | Freq: Four times a day (QID) | INTRAMUSCULAR | Status: DC | PRN

## 2016-03-24 MED ORDER — METFORMIN HCL 500 MG PO TABS: 1000.0000 mg | ORAL_TABLET | Freq: Two times a day (BID) | ORAL | Status: DC

## 2016-03-24 MED ORDER — LISINOPRIL-HYDROCHLOROTHIAZIDE 10-12.5 MG PO TABS: 0.5000 | ORAL_TABLET | Freq: Every day | ORAL | Status: DC

## 2016-03-24 MED ORDER — LEVOTHYROXINE SODIUM 100 MCG PO TABS: 100.0000 ug | ORAL_TABLET | Freq: Every day | ORAL | Status: DC

## 2016-03-24 MED ORDER — METOPROLOL TARTRATE 12.5 MG HALF TABLET
12.5000 mg | ORAL_TABLET | Freq: Two times a day (BID) | ORAL | Status: DC
  Administered 2016-03-24: 12.5 mg via ORAL
  Filled 2016-03-24: qty 1

## 2016-03-24 MED ORDER — HEPARIN BOLUS VIA INFUSION
3000.0000 [IU] | Freq: Once | INTRAVENOUS | Status: AC
  Administered 2016-03-25: 3000 [IU] via INTRAVENOUS
  Filled 2016-03-24: qty 3000

## 2016-03-24 MED ORDER — ASPIRIN EC 81 MG PO TBEC: 81.0000 mg | DELAYED_RELEASE_TABLET | Freq: Every day | ORAL | Status: DC

## 2016-03-24 MED ORDER — HYDROCHLOROTHIAZIDE 10 MG/ML ORAL SUSPENSION
6.2500 mg | Freq: Every day | ORAL | Status: DC
  Filled 2016-03-24: qty 1.25

## 2016-03-24 NOTE — Progress Notes (Signed)
ANTICOAGULATION CONSULT NOTE - Initial Consult  Pharmacy Consult for Heparin Indication: chest pain/ACS  No Known Allergies  Patient Measurements: Height: 61in Weight 48.1kg Heparin Dosing Weight:  48.1 kg  Vital Signs:    Labs: No results for input(s): HGB, HCT, PLT, APTT, LABPROT, INR, HEPARINUNFRC, HEPRLOWMOCWT, CREATININE, CKTOTAL, CKMB, TROPONINI in the last 72 hours.  CrCl cannot be calculated (Patient's most recent lab result is older than the maximum 21 days allowed.).   Medical History: Past Medical History:  Diagnosis Date  . CAROTID ARTERY STENOSIS, BILATERAL 03/07/2008   09/2003  . CHEST PAIN UNSPECIFIED 03/10/2009  . COLONIC POLYPS, HX OF 12/24/2006  . DIABETES MELLITUS, TYPE II 12/24/2006   2004  . HYPERCHOLESTEROLEMIA 09/03/2008  . HYPERTENSION 09/03/2008  . Hyperthyroidism 2004  . HYPOTHYROIDISM, POST-RADIATION 03/07/2008  . OSTEOPOROSIS 12/24/2006  . OVARIAN CYST, RIGHT 03/07/2008  . SMOKER 03/07/2008  . Unspecified disorder of liver 09/03/2008    Medications:  Prescriptions Prior to Admission  Medication Sig Dispense Refill Last Dose  . aspirin 81 MG tablet Take 81 mg by mouth daily.     Taking  . glucose blood (FREESTYLE LITE) test strip Use as instructed    Taking  . hydrocortisone 1 % lotion Apply 1 application topically 3 (three) times daily. As needed for itching 118 mL 5 Taking  . levothyroxine (SYNTHROID, LEVOTHROID) 100 MCG tablet TAKE ONE TABLET BY MOUTH DAILY BEFORE BREAKFAST 90 tablet 0 Taking  . lisinopril-hydrochlorothiazide (PRINZIDE,ZESTORETIC) 10-12.5 MG tablet TAKE ONE-HALF TABLET BY MOUTH ONCE DAILY 45 tablet 1 Taking  . metFORMIN (GLUCOPHAGE) 1000 MG tablet TAKE ONE TABLET BY MOUTH TWICE DAILY WITH  A  MEAL 60 tablet 0   . repaglinide (PRANDIN) 1 MG tablet Take 1 tablet (1 mg total) by mouth 3 (three) times daily before meals. 90 tablet 11   . rosuvastatin (CRESTOR) 20 MG tablet TAKE ONE TABLET BY MOUTH ONCE DAILY 90 tablet 0 Taking  .  [DISCONTINUED] amoxicillin (AMOXIL) 250 MG capsule Take 1 capsule (250 mg total) by mouth 3 (three) times daily. 21 capsule 0     Assessment: 65 year old female with PMH of diabetes mellitus type II, hypertension, tobacco use disorder, hypothyroidism and dyslipidemia has recurrent substernal chest pain and abnormal treadmill stress test.   Goal of Therapy:  Heparin level 0.3-0.7 units/ml Monitor platelets by anticoagulation protocol: Yes   Plan:  F/u baseline labs Heparin 3000 unit IV bolus Heparin infusion at 650 units/hr Daily HL and CBC  Raechel Marcos S. Merilynn Finlandobertson, PharmD, BCPS Clinical Staff Pharmacist Pager [phone]  Misty Stanleyobertson, Ettel Albergo Stillinger 03/24/2016,9:44 PM

## 2016-03-24 NOTE — H&P (Signed)
Referring Physician:  Caleen JobsLillie A Price is an 65 y.o. female.                       Chief Complaint: Chest pain  HPI: 65 year old female with PMH of diabetes mellitus type II, hypertension, tobacco use disorder, hypothyroidism and dyslipidemia has recurrent substernal chest pain and abnormal treadmill stress test. No fever or cough.  Past Medical History:  Diagnosis Date  . CAROTID ARTERY STENOSIS, BILATERAL 03/07/2008   09/2003  . CHEST PAIN UNSPECIFIED 03/10/2009  . COLONIC POLYPS, HX OF 12/24/2006  . DIABETES MELLITUS, TYPE II 12/24/2006   2004  . HYPERCHOLESTEROLEMIA 09/03/2008  . HYPERTENSION 09/03/2008  . Hyperthyroidism 2004  . HYPOTHYROIDISM, POST-RADIATION 03/07/2008  . OSTEOPOROSIS 12/24/2006  . OVARIAN CYST, RIGHT 03/07/2008  . SMOKER 03/07/2008  . Unspecified disorder of liver 09/03/2008      Past Surgical History:  Procedure Laterality Date  . BTL  1989  . Carotid Duplex  09/06/2006  . ESOPHAGOGASTRODUODENOSCOPY  07/31/1982  . Gated Spect Wall Motion Stress Cardiolite  12/04/2003  . I-131 therapy  06/26/2002    Family History  Problem Relation Age of Onset  . Cancer Neg Hx   . Diabetes Mother   . Hypertension Brother    Social History:  reports that she has been smoking Cigarettes.  She has a 35.20 pack-year smoking history. She does not have any smokeless tobacco history on file. She reports that she drinks alcohol. Her drug history is not on file.  Allergies: No Known Allergies  Medications Prior to Admission  Medication Sig Dispense Refill  . aspirin 81 MG tablet Take 81 mg by mouth daily.      Marland Kitchen. glucose blood (FREESTYLE LITE) test strip Use as instructed     . hydrocortisone 1 % lotion Apply 1 application topically 3 (three) times daily. As needed for itching 118 mL 5  . levothyroxine (SYNTHROID, LEVOTHROID) 100 MCG tablet TAKE ONE TABLET BY MOUTH DAILY BEFORE BREAKFAST 90 tablet 0  . lisinopril-hydrochlorothiazide (PRINZIDE,ZESTORETIC) 10-12.5 MG tablet TAKE  ONE-HALF TABLET BY MOUTH ONCE DAILY 45 tablet 1  . metFORMIN (GLUCOPHAGE) 1000 MG tablet TAKE ONE TABLET BY MOUTH TWICE DAILY WITH  A  MEAL 60 tablet 0  . repaglinide (PRANDIN) 1 MG tablet Take 1 tablet (1 mg total) by mouth 3 (three) times daily before meals. 90 tablet 11  . rosuvastatin (CRESTOR) 20 MG tablet TAKE ONE TABLET BY MOUTH ONCE DAILY 90 tablet 0  . [DISCONTINUED] amoxicillin (AMOXIL) 250 MG capsule Take 1 capsule (250 mg total) by mouth 3 (three) times daily. 21 capsule 0    No results found for this or any previous visit (from the past 48 hour(Price)). No results found.  Review Of Systems Constitutional: No fever, chills , weight loss or gain. Eyes: No vision change, Wears reading glasses, no contact lens, no catararact surgery. Ears: No hearing loss, No tinnitus. Throat: wears partial upper dentures. Respiratory: No asthma, positive COPD and shortness of breath. No hemoptysis. Cardiovascular: Positive chest pain, palpitation. No leg edema. Gastrointestinal: No nausea, vomiting or diarrhea or constipation. No GI bleed. No hepatitis. Genitourinary: No dysuria, hematuria or kidney stone. No incontinance. Neurological: No headache, stroke or seizures.  Psychiatry: No psych facility admission for anxiety, depression or suicide. No detox. Skin: No rash. Musculoskeletal: No joint pain or fibromyalgia. No neck pain or back pain. Lymphadenopathy: No lymphadenopathy Hematology: No anemia or easy bruising.  There were no vitals taken for  this visit. There is no height or weight on file to calculate BMI. General appearance: alert, cooperative, appears older than stated age and mild distress Head: Normocephalic, atraumatic Eyes: Conjunctivae/corneas clear. PERRL, EOM'Price intact.  Neck: No adenopathy, no carotid bruit, no JVD, supple, symmetrical, trachea midline and thyroid not enlarged. Resp: Clear to auscultation bilaterally Cardio: Regular rate and rhythm, S1, S2 normal, no murmur,  click, rub or gallop. GI: soft, non-tender; bowel sounds normal; no masses,  no organomegaly Extremities: No cyanosis or edema Skin: Warm and dry. No rashes or lesions Neurologic: Alert and oriented X 3, normal strength and tone. Normal coordination and gait.  Assessment/Plan Chest pain R/O CAD Hyperlipidemia DM, II Tobacco use disorder Hypertension  Nuclear stress test v/Price cardiac cath in AM.  Vicki RodriguezKADAKIA,Vicki Jaros S, MD  03/24/2016, 9:39 PM

## 2016-03-25 ENCOUNTER — Observation Stay (HOSPITAL_COMMUNITY): Payer: Federal, State, Local not specified - PPO

## 2016-03-25 ENCOUNTER — Encounter (HOSPITAL_COMMUNITY): Payer: Self-pay

## 2016-03-25 ENCOUNTER — Encounter (HOSPITAL_COMMUNITY)
Admission: AD | Disposition: A | Discharge status: Home / Self Care | Payer: Self-pay | Source: Ambulatory Visit | Attending: Cardiovascular Disease

## 2016-03-25 ENCOUNTER — Ambulatory Visit: Payer: Federal, State, Local not specified - PPO | Admitting: Cardiology

## 2016-03-25 DIAGNOSIS — E785 Hyperlipidemia, unspecified: Secondary | ICD-10-CM | POA: Diagnosis not present

## 2016-03-25 DIAGNOSIS — I2584 Coronary atherosclerosis due to calcified coronary lesion: Secondary | ICD-10-CM | POA: Diagnosis not present

## 2016-03-25 DIAGNOSIS — I25110 Atherosclerotic heart disease of native coronary artery with unstable angina pectoris: Secondary | ICD-10-CM | POA: Diagnosis not present

## 2016-03-25 DIAGNOSIS — I10 Essential (primary) hypertension: Secondary | ICD-10-CM | POA: Diagnosis not present

## 2016-03-25 HISTORY — PX: CARDIAC CATHETERIZATION: SHX172

## 2016-03-25 LAB — CBC
HEMATOCRIT: 42.2 % (ref 36.0–46.0)
HEMOGLOBIN: 14.4 g/dL (ref 12.0–15.0)
MCH: 29.8 pg (ref 26.0–34.0)
MCHC: 34.1 g/dL (ref 30.0–36.0)
MCV: 87.4 fL (ref 78.0–100.0)
Platelets: 211 10*3/uL (ref 150–400)
RBC: 4.83 MIL/uL (ref 3.87–5.11)
RDW: 13.9 % (ref 11.5–15.5)
WBC: 4.6 10*3/uL (ref 4.0–10.5)

## 2016-03-25 LAB — BASIC METABOLIC PANEL
ANION GAP: 8 (ref 5–15)
BUN: 9 mg/dL (ref 6–20)
CHLORIDE: 105 mmol/L (ref 101–111)
CO2: 27 mmol/L (ref 22–32)
CREATININE: 0.84 mg/dL (ref 0.44–1.00)
Calcium: 9.2 mg/dL (ref 8.9–10.3)
GFR calc non Af Amer: >60 mL/min (ref >60)
Glucose, Bld: 214 mg/dL — ABNORMAL HIGH (ref 65–99)
POTASSIUM: 3.6 mmol/L (ref 3.5–5.1)
Sodium: 140 mmol/L (ref 135–145)

## 2016-03-25 LAB — POCT ACTIVATED CLOTTING TIME: ACTIVATED CLOTTING TIME: 136 s

## 2016-03-25 LAB — PROTIME-INR
INR: 1.08
Prothrombin Time: 14 seconds (ref 11.4–15.2)

## 2016-03-25 LAB — GLUCOSE, CAPILLARY
GLUCOSE-CAPILLARY: 179 mg/dL — AB (ref 65–99)
GLUCOSE-CAPILLARY: 148 mg/dL — AB (ref 65–99)

## 2016-03-25 LAB — LIPID PANEL
Cholesterol: 124 mg/dL (ref 0–200)
HDL: 55 mg/dL (ref >40)
LDL Cholesterol: 52 mg/dL (ref 0–99)
Total CHOL/HDL Ratio: 2.3 RATIO
Triglycerides: 86 mg/dL (ref <150)
VLDL: 17 mg/dL (ref 0–40)

## 2016-03-25 LAB — HEPARIN LEVEL (UNFRACTIONATED): HEPARIN UNFRACTIONATED: 0.5 [IU]/mL (ref 0.30–0.70)

## 2016-03-25 LAB — TROPONIN I
Troponin I: <0.03 ng/mL (ref <0.03)
Troponin I: <0.03 ng/mL (ref <0.03)

## 2016-03-25 SURGERY — LEFT HEART CATH AND CORONARY ANGIOGRAPHY

## 2016-03-25 MED ORDER — METOPROLOL TARTRATE 25 MG PO TABS: 12.5000 mg | ORAL_TABLET | Freq: Two times a day (BID) | ORAL | 1 refills | Status: DC

## 2016-03-25 MED ORDER — SODIUM CHLORIDE 0.9% FLUSH: 3.0000 mL | INTRAVENOUS | Status: DC | PRN

## 2016-03-25 MED ORDER — TECHNETIUM TC 99M TETROFOSMIN IV KIT
10.0000 | PACK | Freq: Once | INTRAVENOUS | Status: AC | PRN
  Administered 2016-03-25: 10 via INTRAVENOUS

## 2016-03-25 MED ORDER — LIDOCAINE HCL (PF) 1 % IJ SOLN
INTRAMUSCULAR | Status: AC
  Filled 2016-03-25: qty 30

## 2016-03-25 MED ORDER — IOPAMIDOL (ISOVUE-370) INJECTION 76%
INTRAVENOUS | Status: AC
  Filled 2016-03-25: qty 100

## 2016-03-25 MED ORDER — HEPARIN (PORCINE) IN NACL 2-0.9 UNIT/ML-% IJ SOLN
INTRAMUSCULAR | Status: DC | PRN
  Administered 2016-03-25: 1500 mL via INTRA_ARTERIAL

## 2016-03-25 MED ORDER — MIDAZOLAM HCL 2 MG/2ML IJ SOLN
INTRAMUSCULAR | Status: DC | PRN
  Administered 2016-03-25: 1 mg via INTRAVENOUS

## 2016-03-25 MED ORDER — SODIUM CHLORIDE 0.9 % IV SOLN: 250.0000 mL | INTRAVENOUS | Status: DC | PRN

## 2016-03-25 MED ORDER — ASPIRIN 81 MG PO CHEW
CHEWABLE_TABLET | ORAL | Status: AC
  Filled 2016-03-25: qty 1

## 2016-03-25 MED ORDER — REGADENOSON 0.4 MG/5ML IV SOLN
INTRAVENOUS | Status: AC
Start: 2016-03-25 — End: 2016-03-25
  Administered 2016-03-25: 0.4 mg via INTRAVENOUS
  Filled 2016-03-25: qty 5

## 2016-03-25 MED ORDER — IOPAMIDOL (ISOVUE-370) INJECTION 76%
INTRAVENOUS | Status: DC | PRN
  Administered 2016-03-25: 65 mL via INTRA_ARTERIAL

## 2016-03-25 MED ORDER — HEPARIN (PORCINE) IN NACL 2-0.9 UNIT/ML-% IJ SOLN
INTRAMUSCULAR | Status: AC
  Filled 2016-03-25: qty 1500

## 2016-03-25 MED ORDER — INSULIN ASPART 100 UNIT/ML ~~LOC~~ SOLN: 0.0000 [IU] | Freq: Three times a day (TID) | SUBCUTANEOUS | Status: DC

## 2016-03-25 MED ORDER — MIDAZOLAM HCL 2 MG/2ML IJ SOLN
INTRAMUSCULAR | Status: AC
  Filled 2016-03-25: qty 2

## 2016-03-25 MED ORDER — TECHNETIUM TC 99M TETROFOSMIN IV KIT
30.0000 | PACK | Freq: Once | INTRAVENOUS | Status: AC | PRN
  Administered 2016-03-25: 30 via INTRAVENOUS

## 2016-03-25 MED ORDER — SODIUM CHLORIDE 0.9 % IV SOLN
INTRAVENOUS | Status: DC
  Administered 2016-03-25: 100 mL/h via INTRAVENOUS

## 2016-03-25 MED ORDER — SODIUM CHLORIDE 0.9% FLUSH: 3.0000 mL | Freq: Two times a day (BID) | INTRAVENOUS | Status: DC

## 2016-03-25 MED ORDER — FENTANYL CITRATE (PF) 100 MCG/2ML IJ SOLN
INTRAMUSCULAR | Status: DC | PRN
  Administered 2016-03-25: 25 ug via INTRAVENOUS

## 2016-03-25 MED ORDER — SODIUM CHLORIDE 0.9 % IV SOLN: INTRAVENOUS | Status: AC

## 2016-03-25 MED ORDER — LIDOCAINE HCL (PF) 1 % IJ SOLN
INTRAMUSCULAR | Status: DC | PRN
  Administered 2016-03-25: 15 mL via INTRADERMAL

## 2016-03-25 MED ORDER — ASPIRIN 81 MG PO CHEW
CHEWABLE_TABLET | ORAL | Status: DC | PRN
  Administered 2016-03-25: 81 mg via ORAL

## 2016-03-25 MED ORDER — REGADENOSON 0.4 MG/5ML IV SOLN
0.4000 mg | Freq: Once | INTRAVENOUS | Status: AC
  Administered 2016-03-25: 0.4 mg via INTRAVENOUS
  Filled 2016-03-25: qty 5

## 2016-03-25 MED ORDER — FENTANYL CITRATE (PF) 100 MCG/2ML IJ SOLN
INTRAMUSCULAR | Status: AC
  Filled 2016-03-25: qty 2

## 2016-03-25 MED ORDER — CLOPIDOGREL BISULFATE 75 MG PO TABS: 75.0000 mg | ORAL_TABLET | Freq: Every day | ORAL | 3 refills | Status: DC

## 2016-03-25 SURGICAL SUPPLY — 8 items
CATH INFINITI 5FR MULTPACK ANG (CATHETERS) ×3 IMPLANT
GUIDEWIRE 3MM J TIP .035 145 (WIRE) ×3 IMPLANT
KIT HEART LEFT (KITS) ×3 IMPLANT
PACK CARDIAC CATHETERIZATION (CUSTOM PROCEDURE TRAY) ×3 IMPLANT
SHEATH PINNACLE 5F 10CM (SHEATH) ×3 IMPLANT
SYR MEDRAD MARK V 150ML (SYRINGE) ×3 IMPLANT
TRANSDUCER W/STOPCOCK (MISCELLANEOUS) ×3 IMPLANT
WIRE HI TORQ VERSACORE-J 145CM (WIRE) ×3 IMPLANT

## 2016-03-25 NOTE — Interval H&P Note (Signed)
History and Physical Interval Note:  03/25/2016 12:55 PM  Vicki Price  has presented today for surgery, with the diagnosis of cp  The various methods of treatment have been discussed with the patient and family. After consideration of risks, benefits and other options for treatment, the patient has consented to  Procedure(s): Left Heart Cath and Coronary Angiography (N/A) as a surgical intervention .  The patient's history has been reviewed, patient examined, no change in status, stable for surgery.  I have reviewed the patient's chart and labs.  Questions were answered to the patient's satisfaction.     Antron Seth S

## 2016-03-25 NOTE — Progress Notes (Signed)
Pt directly admitted to 3W32  By Dr. Algie CofferKadakia. Pt arrived through the ED with admission orders by Dr. Algie CofferKadakia in place. Pt alert and oriented, arrived by wheelchair with husband at bedside. Pt oriented to unit and floor, admission hx and assessment completed, see flowsheet. No c/o of CP at this time. Will continue to monitor.

## 2016-03-25 NOTE — Discharge Summary (Signed)
Physician Discharge Summary  Patient ID: Vicki Price MRN: [id number] DOB/AGE: 64/08/1950 64 y.o.  Admit date: 03/24/2016 Discharge date: 03/25/2016  Admission Diagnoses: Chest pain R/O CAD Hyperlipidemia DM, II Tobacco use disorder Hypertension  Discharge Diagnoses:  Principal Problem: * Chest pain, precordial * Active Problems:   Coronary artery disease   Hyperlipidemia   Tobacco use disorder   Essential hypertension   Diabetes Mellitus type II (HCC)   Peripheral vascular disease  Discharged Condition: good  Hospital Course: 65 year old female with PMH of DM, II, tobacco use disorder, hypothyroidism and dyslipidemia had recurrent chest pain, abnormal stress test. She underwent cardiac cath that showed mild multivessel native vessel coronary artery disease. She had significant disease of her abdominal aorta and run off. She was discharged home in stable conditions with post cardiac catheterization instructions and she will follow up with me in 1 week and with primary care in 1 month.  Consults: cardiology  Significant Diagnostic Studies: labs: Near normal CBC and BMET except elevated blood sugars.  Nuclear stress test: EKG and chest pain positive with subtle inferior wall ischemic change.  Cardiac cath: Non-critical stenoses in LAD, LCX and RCA with calcifications in LM and LAD.  Treatments: cardiac meds: lisinopril (generic), metoprolol, amlodipine, Plavix and aspirin.  Discharge Exam: Blood pressure 138/80, pulse 67, temperature 97.5 F (36.4 C), temperature source Oral, resp. rate 12, height 5\' 3"  (1.6 m), weight 46.6 kg (102 lb 12.8 oz), SpO2 100 %. General appearance: alert, cooperative, appears stated age and no distress Head: Normocephalic, atraumatic Eyes:  Pink conjunctivae/corneas clear. PERRL, EOM's intact.  Neck: no adenopathy, no carotid bruit, no JVD, supple, symmetrical, trachea midline and thyroid not enlarged. Resp: clear to auscultation  bilaterally Cardio: Regular rate and rhythm, S1, S2 normal, no murmur, click, rub or gallop GI: soft, non-tender; bowel sounds normal; no masses,  no organomegaly Extremities: extremities normal, atraumatic, no cyanosis or edema Skin: Warm and dry. No rashes or lesions Neurologic: Alert and oriented X 3, normal strength and tone. Normal coordination and gait  Disposition: 01-Home or Self Care     Medication List    STOP taking these medications   repaglinide 1 MG tablet Commonly known as:  PRANDIN     TAKE these medications   amLODipine 2.5 MG tablet Commonly known as:  NORVASC Take 2.5 mg by mouth daily.   aspirin 81 MG tablet Take 81 mg by mouth daily.   BIOTIN PO Take 1 tablet by mouth daily.   clopidogrel 75 MG tablet Commonly known as:  PLAVIX Take 1 tablet (75 mg total) by mouth daily.   hydrocortisone 1 % lotion Apply 1 application topically 3 (three) times daily. As needed for itching   levothyroxine 100 MCG tablet Commonly known as:  SYNTHROID, LEVOTHROID TAKE ONE TABLET BY MOUTH DAILY BEFORE BREAKFAST What changed:  See the new instructions.   lisinopril-hydrochlorothiazide 10-12.5 MG tablet Commonly known as:  PRINZIDE,ZESTORETIC TAKE ONE-HALF TABLET BY MOUTH ONCE DAILY   metFORMIN 1000 MG tablet Commonly known as:  GLUCOPHAGE TAKE ONE TABLET BY MOUTH TWICE DAILY WITH  A  MEAL What changed:  See the new instructions.   metoprolol tartrate 25 MG tablet Commonly known as:  LOPRESSOR Take 0.5 tablets (12.5 mg total) by mouth 2 (two) times daily.   nitroGLYCERIN 0.4 MG SL tablet Commonly known as:  NITROSTAT Place 0.4 mg under the tongue every 5 (five) minutes as needed for chest pain.   rosuvastatin 20 MG tablet Commonly known  as:  CRESTOR TAKE ONE TABLET BY MOUTH ONCE DAILY What changed:  See the new instructions.      Follow-up Information    Romero BellingELLISON, SEAN, MD. Schedule an appointment as soon as possible for a visit in 1 month(s).    Specialty:  Endocrinology Contact information: [address] [place] [address] [phone]        Ricki RodriguezKADAKIA,Basel Defalco S, MD. Schedule an appointment as soon as possible for a visit in 1 week(s).   Specialty:  Cardiology Contact information: [address] [place] [address] [phone]           Signed: Ricki RodriguezKADAKIA,Gazella Anglin S 03/25/2016, 7:16 PM

## 2016-03-25 NOTE — Progress Notes (Signed)
ANTICOAGULATION CONSULT NOTE - Follow Up Consult  Pharmacy Consult for Heparin Indication: chest pain/ACS  No Known Allergies  Patient Measurements: Height: 5\' 3"  (160 cm) Weight: 102 lb 12.8 oz (46.6 kg) IBW/kg (Calculated) : 52.4 Heparin Dosing Weight : 46.6 kg  Vital Signs: Temp: 97.5 F (36.4 C) (11/16 0646) Temp Source: Oral (11/16 0646) BP: 126/56 (11/16 0901) Pulse Rate: 93 (11/16 0901)  Labs:  Recent Labs  03/24/16 2202 03/25/16 0226 03/25/16 1042  HGB 16.2*  --  14.4  HCT 46.9*  --  42.2  PLT 225  --  211  APTT 32  --   --   LABPROT 12.5  --  14.0  INR 0.94  --  1.08  HEPARINUNFRC  --   --  0.50  CREATININE 0.88  --  0.84  TROPONINI <0.03 <0.03 <0.03    Estimated Creatinine Clearance: 49.8 mL/min (by C-G formula based on SCr of 0.84 mg/dL).   Medications:  PTA medication reconciliation not completed at this time.  Assessment: 65 year old female with PMH of diabetes mellitus type II, hypertension, tobacco use disorder, hypothyroidism and dyslipidemia has recurrent substernal chest pain and abnormal treadmill stress test.   Initial HL =0.50 on IV heairn 650 units/hr.  CBC stable, no bleeding noted Patient in cardiac cath lab now   Goal of Therapy:  Heparin level 0.3-0.7 units/ml Monitor platelets by anticoagulation protocol: Yes   Plan:  Will follow up after cath to see if heparin to restart.  Daily HL and CBC  Thank you for allowing pharmacy to be part of this patients care team.  Noah Delaineuth Helia Haese, RPh Clinical Pharmacist Pager: [phone] 03/25/2016,1:23 PM

## 2016-03-25 NOTE — Progress Notes (Signed)
Pt has been discharged as ordered by Dr. Algie CofferKadakia. Pt has been given AVS print out and all concerns and questions have been addressed. Pt educated on femoral site care from cardiac cath. IV  Has been removed and cardiac monitoring discontinued. Pt has no further concerns at this time.

## 2016-03-25 NOTE — Progress Notes (Signed)
Floor nurse Dawn notified to maintain NPO status post stress test per Dr Roseanne KaufmanKadakia's recommendation

## 2016-04-05 ENCOUNTER — Other Ambulatory Visit: Payer: Self-pay | Admitting: Endocrinology

## 2016-04-06 ENCOUNTER — Ambulatory Visit (INDEPENDENT_AMBULATORY_CARE_PROVIDER_SITE_OTHER): Payer: Federal, State, Local not specified - PPO

## 2016-04-06 ENCOUNTER — Ambulatory Visit: Payer: Federal, State, Local not specified - PPO

## 2016-04-06 DIAGNOSIS — Z23 Encounter for immunization: Secondary | ICD-10-CM

## 2016-04-07 ENCOUNTER — Other Ambulatory Visit: Payer: Self-pay | Admitting: Endocrinology

## 2016-04-19 ENCOUNTER — Other Ambulatory Visit: Payer: Self-pay | Admitting: Endocrinology

## 2016-04-28 ENCOUNTER — Encounter: Payer: Self-pay | Admitting: Endocrinology

## 2016-04-28 LAB — HM DIABETES EYE EXAM

## 2016-05-05 ENCOUNTER — Other Ambulatory Visit: Payer: Self-pay | Admitting: Endocrinology

## 2016-05-12 ENCOUNTER — Telehealth: Payer: Self-pay | Admitting: Endocrinology

## 2016-05-12 NOTE — Telephone Encounter (Signed)
Pt would like for Megan to give her a call, she said she got a message from her but didn't quite understand what she was saying.

## 2016-05-13 NOTE — Telephone Encounter (Signed)
I contacted the patient and advised her recent test came back normal. Patient voiced understanding.

## 2016-05-21 ENCOUNTER — Encounter: Payer: Self-pay | Admitting: Endocrinology

## 2016-05-21 ENCOUNTER — Ambulatory Visit (INDEPENDENT_AMBULATORY_CARE_PROVIDER_SITE_OTHER): Payer: Federal, State, Local not specified - PPO | Admitting: Endocrinology

## 2016-05-21 VITALS — BP 102/64 | HR 75 | Ht 61.0 in | Wt 105.0 lb

## 2016-05-21 DIAGNOSIS — E1151 Type 2 diabetes mellitus with diabetic peripheral angiopathy without gangrene: Secondary | ICD-10-CM | POA: Diagnosis not present

## 2016-05-21 LAB — POCT GLYCOSYLATED HEMOGLOBIN (HGB A1C): Hemoglobin A1C: 7.3

## 2016-05-21 MED ORDER — AMLODIPINE BESYLATE 2.5 MG PO TABS: 1.2500 mg | ORAL_TABLET | Freq: Every day | ORAL | 11 refills | Status: DC

## 2016-05-21 MED ORDER — REPAGLINIDE 0.5 MG PO TABS: 0.5000 mg | ORAL_TABLET | Freq: Three times a day (TID) | ORAL | 11 refills | Status: DC

## 2016-05-21 NOTE — Progress Notes (Signed)
Subjective:    Patient ID: Vicki Price, female    DOB: 1950/09/24, 66 y.o.   MRN: [id number]  HPI The state of at least three ongoing medical problems is addressed today, with interval history of each noted here:  Pt returns for f/u of diabetes mellitus:  DM type: 2 Dx'ed:  2007 Complications: CAD and PAD Therapy: 2 oral meds. DKA: never Severe hypoglycemia: never Pancreatitis: never Other: she refuses [place] and actos, out of fear of side-effects; she did not tolerate parlodel (mood swings).  Interval history: no weight change HTN: she denies dizziness She quit smoking 2 weeks ago.  Denies cough Past Medical History:  Diagnosis Date  . CAROTID ARTERY STENOSIS, BILATERAL 03/07/2008   09/2003  . CHEST PAIN UNSPECIFIED 03/10/2009  . COLONIC POLYPS, HX OF 12/24/2006  . DIABETES MELLITUS, TYPE II 12/24/2006   2004  . HYPERCHOLESTEROLEMIA 09/03/2008  . HYPERTENSION 09/03/2008  . Hyperthyroidism 2004  . HYPOTHYROIDISM, POST-RADIATION 03/07/2008  . OSTEOPOROSIS 12/24/2006  . OVARIAN CYST, RIGHT 03/07/2008  . SMOKER 03/07/2008  . Unspecified disorder of liver 09/03/2008    Past Surgical History:  Procedure Laterality Date  . BTL  1989  . CARDIAC CATHETERIZATION N/A 03/25/2016   Procedure: Left Heart Cath and Coronary Angiography;  Surgeon: Orpah Cobb, MD;  Location: [facility];  Service: Cardiovascular;  Laterality: N/A;  . Carotid Duplex  09/06/2006  . ESOPHAGOGASTRODUODENOSCOPY  07/31/1982  . Gated Spect Wall Motion Stress Cardiolite  12/04/2003  . I-131 therapy  06/26/2002    Social History   Social History  . Marital status: Married    Spouse name: N/A  . Number of children: N/A  . Years of education: N/A   Occupational History  . Homemaker    Social History Main Topics  . Smoking status: Current Every Day Smoker    Packs/day: 0.80    Years: 44.00    Types: Cigarettes  . Smokeless tobacco: Not on file     Comment: Counseled to quit smoking  immediately. Nicotine replacement therapy discussed and offered.  . Alcohol use 0.0 oz/week     Comment: occasionally  . Drug use: Unknown  . Sexual activity: Yes    Birth control/ protection: Surgical     Comment: BTL   Other Topics Concern  . Not on file   Social History Narrative   Does not work outside the home    Current Outpatient Prescriptions on File Prior to Visit  Medication Sig Dispense Refill  . aspirin 81 MG tablet Take 81 mg by mouth daily.      Marland Kitchen BIOTIN PO Take 1 tablet by mouth daily.    . clopidogrel (PLAVIX) 75 MG tablet Take 1 tablet (75 mg total) by mouth daily. 30 tablet 3  . hydrocortisone 1 % lotion Apply 1 application topically 3 (three) times daily. As needed for itching 118 mL 5  . levothyroxine (SYNTHROID, LEVOTHROID) 100 MCG tablet TAKE ONE TABLET BY MOUTH ONCE DAILY BEFORE BREAKFAST 90 tablet 0  . lisinopril-hydrochlorothiazide (PRINZIDE,ZESTORETIC) 10-12.5 MG tablet TAKE ONE-HALF TABLET BY MOUTH ONCE DAILY 45 tablet 1  . metFORMIN (GLUCOPHAGE) 1000 MG tablet TAKE ONE TABLET BY MOUTH TWICE DAILY WITH  A  MEAL 60 tablet 0  . metoprolol tartrate (LOPRESSOR) 25 MG tablet Take 0.5 tablets (12.5 mg total) by mouth 2 (two) times daily. 30 tablet 1  . nitroGLYCERIN (NITROSTAT) 0.4 MG SL tablet Place 0.4 mg under the tongue every 5 (five) minutes as needed for chest  pain.     . rosuvastatin (CRESTOR) 20 MG tablet TAKE ONE TABLET BY MOUTH ONCE DAILY 90 tablet 0   No current facility-administered medications on file prior to visit.     No Known Allergies  Family History  Problem Relation Age of Onset  . Diabetes Mother   . Hypertension Brother   . Cancer Neg Hx     BP 102/64   Pulse 75   Ht 5\' 1"  (1.549 m)   Wt 105 lb (47.6 kg)   SpO2 99%   BMI 19.84 kg/m   Review of Systems Denies sob and chest pain    Objective:   Physical Exam VITAL SIGNS:  See vs page GENERAL: no distress Pulses: dorsalis pedis intact bilat.   MSK: no deformity of the  feet CV: no leg edema Skin:  no ulcer on the feet.  normal color and temp on the feet. Neuro: sensation is intact to touch on the feet.   Ext: There is bilateral onychomycosis of the toenails.    A1c=7.3%     Assessment & Plan:  Type 2 DM: worse Smoking: she has recently quit: She declines quit-smoking medication.  HTN: slightly overcontrolled.  Patient is advised the following: Patient Instructions  Please resume the repaglinide. I have sent a prescription to your pharmacy Let me know if you want quit-smoking medication. Reduce the amlodipine to 1/2 pill per day. I have sent a prescription to your pharmacy check your blood sugar once a day.  vary the time of day when you check, between before the 3 meals, and at bedtime.  also check if you have symptoms of your blood sugar being too high or too low.  please keep a record of the readings and bring it to your next appointment here (or you can bring the meter itself).  You can write it on any piece of paper.  please call us sooner if your blood sugar goes below 70, or if you have a lot of readings over 200.

## 2016-05-21 NOTE — Patient Instructions (Addendum)
Please resume the repaglinide. I have sent a prescription to your pharmacy Let me know if you want quit-smoking medication. Reduce the amlodipine to 1/2 pill per day. I have sent a prescription to your pharmacy check your blood sugar once a day.  vary the time of day when you check, between before the 3 meals, and at bedtime.  also check if you have symptoms of your blood sugar being too high or too low.  please keep a record of the readings and bring it to your next appointment here (or you can bring the meter itself).  You can write it on any piece of paper.  please call us sooner if your blood sugar goes below 70, or if you have a lot of readings over 200.

## 2016-06-08 ENCOUNTER — Other Ambulatory Visit: Payer: Self-pay | Admitting: Endocrinology

## 2016-06-14 ENCOUNTER — Encounter: Payer: Self-pay | Admitting: Endocrinology

## 2016-06-14 ENCOUNTER — Ambulatory Visit (INDEPENDENT_AMBULATORY_CARE_PROVIDER_SITE_OTHER): Payer: Federal, State, Local not specified - PPO | Admitting: Endocrinology

## 2016-06-14 VITALS — BP 110/60 | HR 74 | Ht 61.0 in | Wt 107.0 lb

## 2016-06-14 DIAGNOSIS — Z0000 Encounter for general adult medical examination without abnormal findings: Secondary | ICD-10-CM | POA: Diagnosis not present

## 2016-06-14 NOTE — Patient Instructions (Addendum)
Please consider these measures for your health:  minimize alcohol.  Do not use tobacco products.  Have a colonoscopy at least every 10 years from age 65.  Women should have an annual mammogram from age 65.  Keep firearms safely stored.  Always use seat belts.  have working smoke alarms in your home.  See an eye doctor and dentist regularly.  Never drive under the influence of alcohol or drugs (including prescription drugs).   please let me know what your wishes would be, if artificial life support measures should become necessary.  It is critically important to prevent falling down (keep floor areas well-lit, dry, and free of loose objects.  If you have a cane, walker, or wheelchair, you should use it, even for short trips around the house.  Wear flat-soled shoes.  Also, try not to rush).   Please stop taking the amlodipine pill.  Please come back for a follow-up appointment in 3 months.

## 2016-06-14 NOTE — Progress Notes (Signed)
Subjective:    Patient ID: Vicki Price, female    DOB: 02/06/1951, 66 y.o.   MRN: [id number]  HPI Pt is here for regular wellness examination, and is feeling pretty well in general, and says chronic med probs are stable, except as noted below She quit smoking 5 weeks ago.  Past Medical History:  Diagnosis Date  . CAROTID ARTERY STENOSIS, BILATERAL 03/07/2008   09/2003  . CHEST PAIN UNSPECIFIED 03/10/2009  . COLONIC POLYPS, HX OF 12/24/2006  . DIABETES MELLITUS, TYPE II 12/24/2006   2004  . HYPERCHOLESTEROLEMIA 09/03/2008  . HYPERTENSION 09/03/2008  . Hyperthyroidism 2004  . HYPOTHYROIDISM, POST-RADIATION 03/07/2008  . OSTEOPOROSIS 12/24/2006  . OVARIAN CYST, RIGHT 03/07/2008  . SMOKER 03/07/2008  . Unspecified disorder of liver 09/03/2008    Past Surgical History:  Procedure Laterality Date  . BTL  1989  . CARDIAC CATHETERIZATION N/A 03/25/2016   Procedure: Left Heart Cath and Coronary Angiography;  Surgeon: Orpah CobbAjay Kadakia, MD;  Location: [facility];  Service: Cardiovascular;  Laterality: N/A;  . Carotid Duplex  09/06/2006  . ESOPHAGOGASTRODUODENOSCOPY  07/31/1982  . Gated Spect Wall Motion Stress Cardiolite  12/04/2003  . I-131 therapy  06/26/2002    Social History   Social History  . Marital status: Married    Spouse name: N/A  . Number of children: N/A  . Years of education: N/A   Occupational History  . Homemaker    Social History Main Topics  . Smoking status: Current Every Day Smoker    Packs/day: 0.80    Years: 44.00    Types: Cigarettes  . Smokeless tobacco: Never Used     Comment: Counseled to quit smoking immediately. Nicotine replacement therapy discussed and offered.  . Alcohol use 0.0 oz/week     Comment: occasionally  . Drug use: Unknown  . Sexual activity: Yes    Birth control/ protection: Surgical     Comment: BTL   Other Topics Concern  . Not on file   Social History Narrative   Does not work outside the home    Current  Outpatient Prescriptions on File Prior to Visit  Medication Sig Dispense Refill  . aspirin 81 MG tablet Take 81 mg by mouth daily.      Marland Kitchen. BIOTIN PO Take 1 tablet by mouth daily.    . hydrocortisone 1 % lotion Apply 1 application topically 3 (three) times daily. As needed for itching 118 mL 5  . levothyroxine (SYNTHROID, LEVOTHROID) 100 MCG tablet TAKE ONE TABLET BY MOUTH ONCE DAILY BEFORE BREAKFAST 90 tablet 0  . lisinopril-hydrochlorothiazide (PRINZIDE,ZESTORETIC) 10-12.5 MG tablet TAKE ONE-HALF TABLET BY MOUTH ONCE DAILY 45 tablet 1  . metFORMIN (GLUCOPHAGE) 1000 MG tablet TAKE ONE TABLET BY MOUTH TWICE DAILY WITH  A  MEAL 60 tablet 0  . metoprolol tartrate (LOPRESSOR) 25 MG tablet Take 0.5 tablets (12.5 mg total) by mouth 2 (two) times daily. 30 tablet 1  . nitroGLYCERIN (NITROSTAT) 0.4 MG SL tablet Place 0.4 mg under the tongue every 5 (five) minutes as needed for chest pain.     . repaglinide (PRANDIN) 0.5 MG tablet Take 1 tablet (0.5 mg total) by mouth 3 (three) times daily before meals. 90 tablet 11  . rosuvastatin (CRESTOR) 20 MG tablet TAKE ONE TABLET BY MOUTH ONCE DAILY 90 tablet 0  . clopidogrel (PLAVIX) 75 MG tablet Take 1 tablet (75 mg total) by mouth daily. (Patient not taking: Reported on 06/14/2016) 30 tablet 3   No current  facility-administered medications on file prior to visit.     No Known Allergies  Family History  Problem Relation Age of Onset  . Diabetes Mother   . Hypertension Brother   . Cancer Neg Hx     BP 110/60   Pulse 74   Ht 5\' 1"  (1.549 m)   Wt 107 lb (48.5 kg)   SpO2 94%   BMI 20.22 kg/m    Review of Systems Constitutional: Negative for unexpected weight change.  HENT: Negative for hearing loss.   Eyes: Negative for visual disturbance.  Respiratory: Negative for shortness of breath.   Cardiovascular: she seldom has chest pain.   Gastrointestinal: Negative for anal bleeding.  Endocrine: Negative for cold intolerance and hypoglycemia.    Genitourinary: Negative for hematuria.  Musculoskeletal: Negative for back pain.  Skin: Negative for rash.   Allergic/Immunologic: Negative for environmental allergies.  Neurological: Negative for numbness.  Hematological: Does not bruise/bleed easily.  Psychiatric/Behavioral: Negative for dysphoric mood.     Objective:   Physical Exam VS: see vs page GEN: no distress HEAD: head: no deformity eyes: no periorbital swelling, no proptosis external nose and ears are normal mouth: no lesion seen NECK: supple, thyroid is not enlarged CHEST WALL: no deformity LUNGS:  Clear to auscultation BREASTS: sees gyn CV: reg rate and rhythm, no murmur ABD: abdomen is soft, nontender.  no hepatosplenomegaly.  not distended.  no hernia GENITALIA/RECTAL: sees gyn MUSCULOSKELETAL: muscle bulk and strength are grossly normal.  no obvious joint swelling.  gait is normal and steady EXTEMITIES: no deformity.  no ulcer on the feet.  feet are of normal color and temp.  no edema.  There is bilateral onychomycosis of the toenails.   PULSES: dorsalis pedis intact bilat.  no carotid bruit NEURO:  cn 2-12 grossly intact.   readily moves all 4's.  sensation is intact to touch on the feet SKIN:  Normal texture and temperature.   NODES:  None palpable at the neck PSYCH: alert, well-oriented.  Does not appear anxious nor depressed.      Assessment & Plan:  Wellness visit today, with problems stable, except as noted.

## 2016-06-14 NOTE — Progress Notes (Signed)
we discussed code status.  pt requests full code, but would not want to be started or maintained on artificial life-support measures if there was not a reasonable chance of recovery 

## 2016-06-20 DIAGNOSIS — I739 Peripheral vascular disease, unspecified: Secondary | ICD-10-CM | POA: Diagnosis present

## 2016-06-20 NOTE — H&P (Signed)
OFFICE VISIT NOTES COPIED TO EPIC FOR DOCUMENTATION  . History of Present Illness Vicki Price(Ashton H. Kelley FNP-C; 06/07/2016 5:43 PM) Patient words: f/u for lower extremeity duplex; last office visit 05/18/2016.  The patient is a 66 year old female who presents for a Follow-up for Peripheral vascular disease. Patient was admitted with chest pain suggestive of unstable angina in November 2017 to the hospital and underwent coronary angiography revealing mild noncritical coronary artery disease and preserved LVEF. She had a mildly abnormal stress test revealing inferior ischemia.   She is referred to me by Dr. Orpah CobbAjay Kadakia for evaluation of PAD. She has noticed worsening symptoms especially left hip and thigh. Left thigh and also bilateral hip claudication since then with activity. She also feels weakness in her anterior part of her legs bilaterally left leg worse in the right. No ulceration. Patient underwent ABI's for further evaluation and presents for re-evaluation of symptoms and to discuss results.  Her past medical history is significant for hypertension, hyperlipidemia, diabetes mellitus. She also has history of tobacco use disorder.  Denies chest pain, PND, orthopnea, palpitations, edema, syncopal episodes, or symptoms of TIA.     Problem List/Past Medical Chase Caller(Tiffany Thorpe; 06/07/2016 10:45 AM) Claudication, intermittent (I73.9)  Lower extremity arterial duplex 05/19/2016: No hemodynamically significant stenoses are identified in the lower extremity arterial system. There is biphasic waveforms noted below the knee bilaterally suggestive of diffuse disease. This exam reveals moderately decreased perfusion of the lower extremity with RABI 0.74 and LABI 0.67, noted at the post tibial artery level. Benign essential hypertension (I10)  Diabetes mellitus with peripheral artery disease (E11.51)  Nonobstructive atherosclerosis of coronary artery (I70.91)  Carotid atherosclerosis, bilateral (Z61.09(I65.23)   Carotid artery duplex 01/27/2015: Mild carotid atherosclerosis. Tobacco use disorder, continuous (F17.209)  Hyperlipidemia (E78.5)   Allergies Chase Caller(Tiffany Thorpe; 06/07/2016 10:45 AM) No Known Allergies 05/18/2016  Family History Chase Caller(Tiffany Thorpe; 06/07/2016 10:45 AM) Mother  Deceased. at age 65 from MI, DM Father  Deceased. passed away before pt was born, no known heart issues Sister 5  6 total, 4 deceaced, oldest passed from MI at age 65 Brother 4  3 deceased, 1 living, no heart conditions  Social History Chase Caller(Tiffany Thorpe; 06/07/2016 10:50 AM) Current tobacco use  Former smoker. quit 05/24/2016 Alcohol Use  Occasional alcohol use. Marital status  Married. Living Situation  Lives with spouse. Number of Children  3.  Past Surgical History Chase Caller(Tiffany Thorpe; 06/07/2016 10:45 AM) None 05/18/2016  Medication History (Tiffany Fenton [place]pe; 06/07/2016 10:55 AM) Cilostazol (100MG  Tablet, 1 (one) Tablet Oral two times daily, Taken starting 05/18/2016) Active. (Discontinue Plavix) AmLODIPine Besylate (2.5MG  Tablet, 1 Oral daily) Active. Aspirin (81MG  Tablet DR, 1 Oral daily) Active. MetFORMIN HCl (1000MG  Tablet, 1 Oral two times daily) Active. Clopidogrel Bisulfate (75MG  Tablet, 1 Oral daily) Discontinued: changed to Cilostazol. Rosuvastatin Calcium (20MG  Tablet, 1 Oral daily) Active. Metoprolol Tartrate (25MG  Tablet, 1/2 Oral twice a day) Active. Lisinopril-Hydrochlorothiazide (10-12.5MG  Tablet, 1/2 Oral daily) Active. Levothyroxine Sodium (100MCG Tablet, 1 Oral daily) Active. Nitroglycerin (0.4MG  Tab Sublingual, Sublingual as needed) Active. Biotin (5MG  Capsule, 1 Oral two times daily) Active. Medications Reconciled (verbally with patient with list present)  Diagnostic Studies History Alysia Penna(Charavina Reader; 06/07/2016 8:34 AM) Lower Extremity Dopplers 05/19/2016 No hemodynamically significant stenoses are identified in the lower extremity arterial system. There is biphasic  waveforms noted below the knee bilaterally suggestive of diffuse disease. This exam reveals moderately decreased perfusion of the lower extremity with RABI 0.74 and LABI 0.67, noted at the post tibial artery level.  Review of Systems Vicki Bouchard, FNP-C; 06/07/2016 6:1 PM) General Not Present- Anorexia, Fatigue and Fever. Respiratory Not Present- Cough. Cardiovascular Present- Claudications. Not Present- Edema, Orthopnea and Paroxysmal Nocturnal Dyspnea. Gastrointestinal Not Present- Black, Tarry Stool, Change in Bowel Habits and Nausea. Neurological Not Present- Focal Neurological Symptoms and Syncope. Endocrine Not Present- Cold Intolerance, Excessive Sweating, Heat Intolerance and Thyroid Problems. Hematology Not Present- Anemia, Easy Bruising, Petechiae and Prolonged Bleeding.  Vitals Chase Caller; 06/07/2016 10:56 AM) 06/07/2016 10:47 AM Weight: 108.44 lb Height: 61in Body Surface Area: 1.46 m Body Mass Index: 20.49 kg/m  Pulse: 73 (Regular)  P.OX: 99% (Room air) BP: 128/80 (Sitting, Left Arm, Standard)       Physical Exam Vicki Bouchard, FNP-C; 06/07/2016 6:1 PM) General Mental Status-Alert. General Appearance-Cooperative and Appears stated age. Build & Nutrition-Moderately built and [place].  Head and Neck Thyroid Gland Characteristics - normal size and consistency and no palpable nodules.  Chest and Lung Exam Chest and lung exam reveals -quiet, even and easy respiratory effort with no use of accessory muscles, non-tender and on auscultation, normal breath sounds, no adventitious sounds.  Cardiovascular Cardiovascular examination reveals -normal heart sounds, regular rate and rhythm with no murmurs.  Abdomen Palpation/Percussion Normal exam - Non Tender and No hepatosplenomegaly.  Peripheral Vascular Lower Extremity Inspection - Bilateral - No Delayed capillary refill, No Varicose ulcers, no Varicose veins. Palpation - Edema -  Bilateral - No edema. Femoral pulse - Left - 1+. Right - 2+. Popliteal pulse - Left - Feeble. Right - 1+. Dorsalis pedis pulse - Left - Feeble. Right - 1+. Posterior tibial pulse - Bilateral - Absent. Carotid arteries - Bilateral-No Carotid bruit.  Neurologic Neurologic evaluation reveals -alert and oriented x 3 with no impairment of recent or remote memory. Motor-Grossly intact without any focal deficits.  Musculoskeletal Global Assessment Left Lower Extremity - no deformities, masses or tenderness, no known fractures. Right Lower Extremity - no deformities, masses or tenderness, no known fractures.    Assessment & Plan Vicki Bouchard FNP-C; 06/07/2016 5:48 PM) Claudication, intermittent (I73.9) Story: Lower extremity arterial duplex 05/19/2016: No hemodynamically significant stenoses are identified in the lower extremity arterial system. There is biphasic waveforms noted below the knee bilaterally suggestive of diffuse disease. This exam reveals moderately decreased perfusion of the lower extremity with RABI 0.74 and LABI 0.67, noted at the post tibial artery level. Future Plans 06/14/2016: METABOLIC PANEL, BASIC [phone]) - one time 06/14/2016: CBC & PLATELETS (AUTO) (60454) - one time 06/14/2016: PT (PROTHROMBIN TIME) (09811) - one time Diabetes mellitus with peripheral artery disease (E11.51) Benign essential hypertension (I10) Impression: EKG 05/18/2016: Sinus rhythm with short PR interval, PR interval of 100-110 milliseconds. Otherwise normal EKG. Former cigarette smoker [phone]) Impression: Recently quit Carotid atherosclerosis, bilateral (N56.21) Story: Carotid artery duplex 01/27/2015: Mild carotid atherosclerosis. Nonobstructive atherosclerosis of coronary artery (I70.91)  Current Plans Mechanism of underlying disease process and action of medications discussed with the patient. I discussed primary/secondary prevention and also dietary counceling was done. Patient is  reporting continued symptoms since starting cilostazol. ABI shows decreased perfusion bilaterally, with left being worse than the right. Disease found in external iliac during coronary angiogram as well. Believe that patient would benefit from lower extremity angiogram and possible intervention. Schedule for lower extremity angiogram and possible angioplasty. We discussed regarding risks, benefits, alternatives to this including continued medical therapy. Patient wants to proceed. Understands <1-2% risk of death, stroke, MI, urgent CABG, bleeding, infection, renal failure but not limited to these.  Patient verbalized understanding and wishes to proceed with procedure. Otherwise no changes in the medications were done today. Her lipids and diabetes are being managed by Dr. Romero Belling and Dr. Orpah Cobb, I will cc my office visit note to them. She reports smoking cessation and I congratulated her on her recent success and encouraged her to continue the good work. I will have her follow up after the procedure.  *I have discussed this case with Dr. Jacinto Halim and he participated in formulating the plan.*  CC: Dr. Romero Belling, Dr. Orpah Cobb  Signed electronically by Vicki Bouchard, FNP-C (06/07/2016 6:02 PM)

## 2016-06-22 ENCOUNTER — Encounter (HOSPITAL_COMMUNITY)
Admission: RE | Disposition: A | Discharge status: Home / Self Care | Payer: Self-pay | Source: Ambulatory Visit | Attending: Cardiology

## 2016-06-22 ENCOUNTER — Ambulatory Visit (HOSPITAL_COMMUNITY)
Admission: RE | Admit: 2016-06-22 | Discharge: 2016-06-22 | Disposition: A | Discharge status: Home / Self Care | Payer: Federal, State, Local not specified - PPO | Source: Ambulatory Visit | Attending: Cardiology | Admitting: Cardiology

## 2016-06-22 DIAGNOSIS — I2510 Atherosclerotic heart disease of native coronary artery without angina pectoris: Secondary | ICD-10-CM | POA: Insufficient documentation

## 2016-06-22 DIAGNOSIS — Z8249 Family history of ischemic heart disease and other diseases of the circulatory system: Secondary | ICD-10-CM | POA: Insufficient documentation

## 2016-06-22 DIAGNOSIS — I10 Essential (primary) hypertension: Secondary | ICD-10-CM | POA: Diagnosis not present

## 2016-06-22 DIAGNOSIS — E785 Hyperlipidemia, unspecified: Secondary | ICD-10-CM | POA: Insufficient documentation

## 2016-06-22 DIAGNOSIS — I6523 Occlusion and stenosis of bilateral carotid arteries: Secondary | ICD-10-CM | POA: Diagnosis not present

## 2016-06-22 DIAGNOSIS — I714 Abdominal aortic aneurysm, without rupture: Secondary | ICD-10-CM | POA: Insufficient documentation

## 2016-06-22 DIAGNOSIS — E1151 Type 2 diabetes mellitus with diabetic peripheral angiopathy without gangrene: Secondary | ICD-10-CM | POA: Diagnosis not present

## 2016-06-22 DIAGNOSIS — I70213 Atherosclerosis of native arteries of extremities with intermittent claudication, bilateral legs: Secondary | ICD-10-CM | POA: Insufficient documentation

## 2016-06-22 DIAGNOSIS — Z87891 Personal history of nicotine dependence: Secondary | ICD-10-CM | POA: Insufficient documentation

## 2016-06-22 DIAGNOSIS — Z7984 Long term (current) use of oral hypoglycemic drugs: Secondary | ICD-10-CM | POA: Diagnosis not present

## 2016-06-22 DIAGNOSIS — I700 Atherosclerosis of aorta: Secondary | ICD-10-CM | POA: Diagnosis not present

## 2016-06-22 DIAGNOSIS — I739 Peripheral vascular disease, unspecified: Secondary | ICD-10-CM | POA: Diagnosis present

## 2016-06-22 HISTORY — PX: PERIPHERAL VASCULAR INTERVENTION: CATH118257

## 2016-06-22 HISTORY — PX: LOWER EXTREMITY ANGIOGRAPHY: CATH118251

## 2016-06-22 LAB — POCT ACTIVATED CLOTTING TIME
ACTIVATED CLOTTING TIME: 219 s
Activated Clotting Time: 175 seconds

## 2016-06-22 LAB — GLUCOSE, CAPILLARY
GLUCOSE-CAPILLARY: 150 mg/dL — AB (ref 65–99)
Glucose-Capillary: 122 mg/dL — ABNORMAL HIGH (ref 65–99)

## 2016-06-22 SURGERY — LOWER EXTREMITY ANGIOGRAPHY

## 2016-06-22 MED ORDER — SODIUM CHLORIDE 0.9% FLUSH: 3.0000 mL | Freq: Two times a day (BID) | INTRAVENOUS | Status: DC

## 2016-06-22 MED ORDER — ONDANSETRON HCL 4 MG/2ML IJ SOLN: 4.0000 mg | Freq: Four times a day (QID) | INTRAMUSCULAR | Status: DC | PRN

## 2016-06-22 MED ORDER — SODIUM CHLORIDE 0.9 % IV BOLUS (SEPSIS)
500.0000 mL | Freq: Once | INTRAVENOUS | Status: AC
  Administered 2016-06-22: 500 mL via INTRAVENOUS

## 2016-06-22 MED ORDER — FENTANYL CITRATE (PF) 100 MCG/2ML IJ SOLN
INTRAMUSCULAR | Status: DC | PRN
  Administered 2016-06-22: 25 ug via INTRAVENOUS
  Administered 2016-06-22 (×2): 50 ug via INTRAVENOUS

## 2016-06-22 MED ORDER — HYDRALAZINE HCL 20 MG/ML IJ SOLN: 5.0000 mg | INTRAMUSCULAR | Status: DC | PRN

## 2016-06-22 MED ORDER — PANTOPRAZOLE SODIUM 40 MG PO TBEC
40.0000 mg | DELAYED_RELEASE_TABLET | Freq: Every day | ORAL | Status: DC
  Filled 2016-06-22: qty 1

## 2016-06-22 MED ORDER — ACETAMINOPHEN 325 MG PO TABS
325.0000 mg | ORAL_TABLET | ORAL | Status: DC | PRN
  Administered 2016-06-22: 650 mg via ORAL
  Filled 2016-06-22 (×2): qty 2

## 2016-06-22 MED ORDER — CLOPIDOGREL BISULFATE 300 MG PO TABS
ORAL_TABLET | ORAL | Status: AC
  Filled 2016-06-22: qty 2

## 2016-06-22 MED ORDER — CLOPIDOGREL BISULFATE 300 MG PO TABS
ORAL_TABLET | ORAL | Status: DC | PRN
  Administered 2016-06-22: 600 mg via ORAL

## 2016-06-22 MED ORDER — HEPARIN SODIUM (PORCINE) 1000 UNIT/ML IJ SOLN
INTRAMUSCULAR | Status: DC | PRN
  Administered 2016-06-22: 4000 [IU] via INTRAVENOUS

## 2016-06-22 MED ORDER — ACETAMINOPHEN 325 MG PO TABS
ORAL_TABLET | ORAL | Status: AC
  Filled 2016-06-22: qty 2

## 2016-06-22 MED ORDER — MIDAZOLAM HCL 2 MG/2ML IJ SOLN
INTRAMUSCULAR | Status: DC | PRN
  Administered 2016-06-22: 2 mg via INTRAVENOUS

## 2016-06-22 MED ORDER — ALUM & MAG HYDROXIDE-SIMETH 200-200-20 MG/5ML PO SUSP
15.0000 mL | ORAL | Status: DC | PRN
  Filled 2016-06-22: qty 30

## 2016-06-22 MED ORDER — LIDOCAINE HCL (PF) 1 % IJ SOLN
INTRAMUSCULAR | Status: DC | PRN
  Administered 2016-06-22 (×2): 12 mL

## 2016-06-22 MED ORDER — HEPARIN (PORCINE) IN NACL 2-0.9 UNIT/ML-% IJ SOLN
INTRAMUSCULAR | Status: DC | PRN
Start: 2016-06-22 — End: 2016-06-22
  Administered 2016-06-22: 1000 mL

## 2016-06-22 MED ORDER — HEPARIN (PORCINE) IN NACL 2-0.9 UNIT/ML-% IJ SOLN
INTRAMUSCULAR | Status: AC
  Filled 2016-06-22: qty 1000

## 2016-06-22 MED ORDER — HEPARIN SODIUM (PORCINE) 1000 UNIT/ML IJ SOLN
INTRAMUSCULAR | Status: AC
  Filled 2016-06-22: qty 1

## 2016-06-22 MED ORDER — ACETAMINOPHEN 325 MG RE SUPP
325.0000 mg | RECTAL | Status: DC | PRN
  Filled 2016-06-22: qty 2

## 2016-06-22 MED ORDER — SODIUM CHLORIDE 0.9 % IV SOLN: INTRAVENOUS | Status: DC

## 2016-06-22 MED ORDER — FENTANYL CITRATE (PF) 100 MCG/2ML IJ SOLN
INTRAMUSCULAR | Status: AC
  Filled 2016-06-22: qty 2

## 2016-06-22 MED ORDER — IODIXANOL 320 MG/ML IV SOLN
INTRAVENOUS | Status: DC | PRN
  Administered 2016-06-22: 155 mL via INTRA_ARTERIAL

## 2016-06-22 MED ORDER — MIDAZOLAM HCL 2 MG/2ML IJ SOLN
INTRAMUSCULAR | Status: AC
  Filled 2016-06-22: qty 2

## 2016-06-22 MED ORDER — LIDOCAINE HCL (PF) 1 % IJ SOLN
INTRAMUSCULAR | Status: AC
  Filled 2016-06-22: qty 30

## 2016-06-22 MED ORDER — SODIUM CHLORIDE 0.9 % IV SOLN: 1.0000 mL/kg/h | INTRAVENOUS | Status: DC

## 2016-06-22 MED ORDER — SODIUM CHLORIDE 0.9% FLUSH: 3.0000 mL | INTRAVENOUS | Status: DC | PRN

## 2016-06-22 MED ORDER — SODIUM CHLORIDE 0.9 % IV SOLN: 250.0000 mL | INTRAVENOUS | Status: DC | PRN

## 2016-06-22 SURGICAL SUPPLY — 21 items
BALLN MUSTANG 6.0X20 75 (BALLOONS) ×3
BALLOON MUSTANG 6.0X20 75 (BALLOONS) ×2 IMPLANT
CATH OMNI FLUSH 5F 65CM (CATHETERS) ×3 IMPLANT
CATH STRAIGHT 5FR 65CM (CATHETERS) ×3 IMPLANT
COVER PRB 48X5XTLSCP FOLD TPE (BAG) ×2 IMPLANT
COVER PROBE 5X48 (BAG) ×1
GUIDEWIRE ANGLED .035X150CM (WIRE) ×3 IMPLANT
KIT ENCORE 26 ADVANTAGE (KITS) ×6 IMPLANT
KIT PV (KITS) ×3 IMPLANT
SHEATH BRITE TIP 7FR 35CM (SHEATH) ×6 IMPLANT
SHEATH PINNACLE 5F 10CM (SHEATH) ×3 IMPLANT
SHEATH PINNACLE 7F 10CM (SHEATH) ×3 IMPLANT
STENT EXPRESS LD 8X27X75 (Permanent Stent) ×3 IMPLANT
STENT EXPRESS LD 9X25X75 (Permanent Stent) ×3 IMPLANT
STOPCOCK MORSE 400PSI 3WAY (MISCELLANEOUS) ×3 IMPLANT
SYRINGE MEDRAD AVANTA MACH 7 (SYRINGE) ×3 IMPLANT
TRANSDUCER W/STOPCOCK (MISCELLANEOUS) ×3 IMPLANT
TRAY PV CATH (CUSTOM PROCEDURE TRAY) ×3 IMPLANT
TUBING CIL FLEX 10 FLL-RA (TUBING) ×3 IMPLANT
WIRE HITORQ VERSACORE ST 145CM (WIRE) ×6 IMPLANT
WIRE MINI STICK MAX (SHEATH) ×3 IMPLANT

## 2016-06-22 NOTE — Discharge Instructions (Signed)
° °  HOLD METFORMIN FOR 48 HOURS. MAY RESUME FRI MORNING.   Femoral Site Care Introduction Refer to this sheet in the next few weeks. These instructions provide you with information about caring for yourself after your procedure. Your health care provider may also give you more specific instructions. Your treatment has been planned according to current medical practices, but problems sometimes occur. Call your health care provider if you have any problems or questions after your procedure. What can I expect after the procedure? After your procedure, it is typical to have the following:  Bruising at the site that usually fades within 1-2 weeks.  Blood collecting in the tissue (hematoma) that may be painful to the touch. It should usually decrease in size and tenderness within 1-2 weeks. Follow these instructions at home:  Take medicines only as directed by your health care provider.  You may shower 24-48 hours after the procedure or as directed by your health care provider. Remove the bandage (dressing) and gently wash the site with plain soap and water. Pat the area dry with a clean towel. Do not rub the site, because this may cause bleeding.  Do not take baths, swim, or use a hot tub until your health care provider approves.  Check your insertion site every day for redness, swelling, or drainage.  Do not apply powder or lotion to the site.  Limit use of stairs to twice a day for the first 2-3 days or as directed by your health care provider.  Do not squat for the first 2-3 days or as directed by your health care provider.  Do not lift over 10 lb (4.5 kg) for 5 days after your procedure or as directed by your health care provider.  Ask your health care provider when it is okay to:  Return to work or school.  Resume usual physical activities or sports.  Resume sexual activity.  Do not drive home if you are discharged the same day as the procedure. Have someone else drive  you.  You may drive 24 hours after the procedure unless otherwise instructed by your health care provider.  Do not operate machinery or power tools for 24 hours after the procedure or as directed by your health care provider.  If your procedure was done as an outpatient procedure, which means that you went home the same day as your procedure, a responsible adult should be with you for the first 24 hours after you arrive home.  Keep all follow-up visits as directed by your health care provider. This is important. Contact a health care provider if:  You have a fever.  You have chills.  You have increased bleeding from the site. Hold pressure on the site. Get help right away if:  You have unusual pain at the site.  You have redness, warmth, or swelling at the site.  You have drainage (other than a small amount of blood on the dressing) from the site.  The site is bleeding, and the bleeding does not stop after 30 minutes of holding steady pressure on the site.  Your leg or foot becomes pale, cool, tingly, or numb. This information is not intended to replace advice given to you by your health care provider. Make sure you discuss any questions you have with your health care provider. Document Released: 12/28/2013 Document Revised: 10/02/2015 Document Reviewed: 11/13/2013  2017 Elsevier

## 2016-06-22 NOTE — Interval H&P Note (Signed)
History and Physical Interval Note:  06/22/2016 7:47 AM  Vicki Price  has presented today for surgery, with the diagnosis of pad  The various methods of treatment have been discussed with the patient and family. After consideration of risks, benefits and other options for treatment, the patient has consented to  Procedure(s): Lower Extremity Angiography (N/A) and possible PTA as a surgical intervention .  The patient's history has been reviewed, patient examined, no change in status, stable for surgery.  I have reviewed the patient's chart and labs.  Questions were answered to the patient's satisfaction.     Yates DecampGANJI, Duaa Stelzner

## 2016-06-22 NOTE — Progress Notes (Addendum)
Site area: RFA Site Prior to Removal:  Level 0 Pressure Applied For:25 min Manual:   yes Patient Status During Pull:  stable Post Pull Site:  Level 0 Post Pull Instructions Given:  yes Post Pull Pulses Present: palpable Dressing Applied:  tegaderm Bedrest begins @ 1145 till 1845 Comments:

## 2016-06-22 NOTE — Progress Notes (Addendum)
Site area: LFA LFV Site Prior to Removal:  Level 0 Pressure Applied For: 35 min Manual:   yes Patient Status During Pull:  stable Post Pull Site:  Level 0 Post Pull Instructions Given: yes  Post Pull Pulses Present: palpable Dressing Applied:  tegaderm Bedrest begins @ 1145 till 1845 Comments:

## 2016-06-23 ENCOUNTER — Encounter (HOSPITAL_COMMUNITY): Payer: Self-pay | Admitting: Cardiology

## 2016-07-12 ENCOUNTER — Other Ambulatory Visit: Payer: Self-pay

## 2016-07-12 MED ORDER — METFORMIN HCL 1000 MG PO TABS: 1000.0000 mg | ORAL_TABLET | Freq: Two times a day (BID) | ORAL | 1 refills | Status: DC

## 2016-07-20 ENCOUNTER — Other Ambulatory Visit: Payer: Self-pay | Admitting: Endocrinology

## 2016-07-28 ENCOUNTER — Telehealth: Payer: Self-pay | Admitting: Endocrinology

## 2016-07-28 NOTE — Telephone Encounter (Signed)
Pt's husband came by and wanted to make sure we are aware that she is using the [facility], also he requests that all of her prescriptions be renewed and sent into this pharmacy.

## 2016-07-28 NOTE — Telephone Encounter (Signed)
I contacted the patient and advised we can [facility] we just needed to know which [facility]. Patient was advised to call back with the fax number so we could submit the refills.

## 2016-07-29 MED ORDER — LISINOPRIL-HYDROCHLOROTHIAZIDE 10-12.5 MG PO TABS: 0.5000 | ORAL_TABLET | Freq: Every day | ORAL | 1 refills | Status: DC

## 2016-07-29 MED ORDER — GLUCOSE BLOOD VI STRP: ORAL_STRIP | 2 refills | Status: DC

## 2016-07-29 MED ORDER — ONETOUCH DELICA LANCETS 33G MISC: 2 refills | Status: DC

## 2016-07-29 MED ORDER — REPAGLINIDE 0.5 MG PO TABS: 0.5000 mg | ORAL_TABLET | Freq: Three times a day (TID) | ORAL | 2 refills | Status: DC

## 2016-07-29 MED ORDER — LEVOTHYROXINE SODIUM 100 MCG PO TABS: ORAL_TABLET | ORAL | 2 refills | Status: DC

## 2016-07-29 MED ORDER — ONETOUCH VERIO IQ SYSTEM W/DEVICE KIT: PACK | 0 refills | Status: AC

## 2016-07-29 MED ORDER — METFORMIN HCL 1000 MG PO TABS: 1000.0000 mg | ORAL_TABLET | Freq: Two times a day (BID) | ORAL | 1 refills | Status: DC

## 2016-07-29 MED ORDER — ROSUVASTATIN CALCIUM 20 MG PO TABS: 20.0000 mg | ORAL_TABLET | Freq: Every day | ORAL | 2 refills | Status: DC

## 2016-07-29 NOTE — Telephone Encounter (Signed)
[facility] is [facility] p # [phone]  Pt also needs rx for a new meter and supplies sent in to this location along with all the rx we rx

## 2016-07-29 NOTE — Telephone Encounter (Signed)
Refills submitted to [facility].

## 2016-07-30 ENCOUNTER — Telehealth: Payer: Self-pay | Admitting: Endocrinology

## 2016-07-30 NOTE — Telephone Encounter (Signed)
I contacted the patient's husband. He advised me the patient is not going to be able and use [facility] due to his insurance. He stated he is going to contact the patient's insurance about her meter and strips and will be able to get a free one. Patient stated at this time she did not need any prescriptions submitted. [facility] removed from pharmacy list.

## 2016-07-30 NOTE — Telephone Encounter (Signed)
Patient want all medication sent to the  [facility] [place] - [address] [phone] (Phone) [phone] (Fax)   And the meter and test strips asap

## 2016-08-13 ENCOUNTER — Telehealth: Payer: Self-pay

## 2016-08-13 NOTE — Telephone Encounter (Signed)
Patient called in stated she received a call from Cardiologist that Dr.Ellison had placed an order. I did not see any recent orders placed from Dr.Ellison, or any order recently in general. I advised patient if they called back to call us.

## 2016-09-17 ENCOUNTER — Other Ambulatory Visit: Payer: Self-pay | Admitting: Endocrinology

## 2016-10-05 ENCOUNTER — Ambulatory Visit (INDEPENDENT_AMBULATORY_CARE_PROVIDER_SITE_OTHER): Payer: Federal, State, Local not specified - PPO

## 2016-10-05 DIAGNOSIS — Z23 Encounter for immunization: Secondary | ICD-10-CM | POA: Diagnosis not present

## 2016-10-10 ENCOUNTER — Other Ambulatory Visit: Payer: Self-pay | Admitting: Endocrinology

## 2016-10-19 ENCOUNTER — Other Ambulatory Visit: Payer: Self-pay | Admitting: Endocrinology

## 2016-10-20 ENCOUNTER — Encounter: Payer: Self-pay | Admitting: Acute Care

## 2016-11-16 ENCOUNTER — Other Ambulatory Visit: Payer: Self-pay | Admitting: Endocrinology

## 2016-11-30 ENCOUNTER — Ambulatory Visit (INDEPENDENT_AMBULATORY_CARE_PROVIDER_SITE_OTHER): Payer: Federal, State, Local not specified - PPO | Admitting: Endocrinology

## 2016-11-30 ENCOUNTER — Encounter: Payer: Self-pay | Admitting: Endocrinology

## 2016-11-30 VITALS — BP 102/70 | HR 80 | Wt 108.0 lb

## 2016-11-30 DIAGNOSIS — E1151 Type 2 diabetes mellitus with diabetic peripheral angiopathy without gangrene: Secondary | ICD-10-CM | POA: Diagnosis not present

## 2016-11-30 LAB — HEMOGLOBIN A1C: HEMOGLOBIN A1C: 6.8 % — AB (ref 4.6–6.5)

## 2016-11-30 NOTE — Progress Notes (Signed)
Subjective:    Patient ID: Vicki Price, female    DOB: 1951-01-10, 66 y.o.   MRN: [id number]  HPI Pt returns for f/u of diabetes mellitus:  DM type: 2 Dx'ed:  2703 Complications: CAD and PAD Therapy: 2 oral meds. DKA: never Severe hypoglycemia: never Pancreatitis: never Other: she refuses [place] and actos, out of fear of side-effects; she did not tolerate parlodel (mood swings).  Interval history: no weight change.  pt states she feels well in general. Pt states few years of intermitt slight pain at the OUQ of the left breast.  No assoc drainage. Past Medical History:  Diagnosis Date  . CAROTID ARTERY STENOSIS, BILATERAL 03/07/2008   09/2003  . CHEST PAIN UNSPECIFIED 03/10/2009  . COLONIC POLYPS, HX OF 12/24/2006  . DIABETES MELLITUS, TYPE II 12/24/2006   2004  . HYPERCHOLESTEROLEMIA 09/03/2008  . HYPERTENSION 09/03/2008  . Hyperthyroidism 2004  . HYPOTHYROIDISM, POST-RADIATION 03/07/2008  . OSTEOPOROSIS 12/24/2006  . OVARIAN CYST, RIGHT 03/07/2008  . SMOKER 03/07/2008  . Unspecified disorder of liver 09/03/2008    Past Surgical History:  Procedure Laterality Date  . BTL  1989  . CARDIAC CATHETERIZATION N/A 03/25/2016   Procedure: Left Heart Cath and Coronary Angiography;  Surgeon: Dixie Dials, MD;  Location: [facility];  Service: Cardiovascular;  Laterality: N/A;  . Carotid Duplex  09/06/2006  . ESOPHAGOGASTRODUODENOSCOPY  07/31/1982  . Gated Spect Wall Motion Stress Cardiolite  12/04/2003  . I-131 therapy  06/26/2002  . LOWER EXTREMITY ANGIOGRAPHY N/A 06/22/2016   Procedure: Lower Extremity Angiography;  Surgeon: Adrian Prows, MD;  Location: [facility];  Service: Cardiovascular;  Laterality: N/A;  . PERIPHERAL VASCULAR INTERVENTION Bilateral 06/22/2016   Procedure: Peripheral Vascular Intervention-Bilateral Common Iliac Arteries;  Surgeon: Adrian Prows, MD;  Location: [facility];  Service: Cardiovascular;  Laterality: Bilateral;    Social History    Social History  . Marital status: Married    Spouse name: N/A  . Number of children: N/A  . Years of education: N/A   Occupational History  . Homemaker    Social History Main Topics  . Smoking status: Current Every Day Smoker    Packs/day: 0.80    Years: 44.00    Types: Cigarettes  . Smokeless tobacco: Never Used     Comment: Counseled to quit smoking immediately. Nicotine replacement therapy discussed and offered.  . Alcohol use 0.0 oz/week     Comment: occasionally  . Drug use: Unknown  . Sexual activity: Yes    Birth control/ protection: Surgical     Comment: BTL   Other Topics Concern  . Not on file   Social History Narrative   Does not work outside the home    Current Outpatient Prescriptions on File Prior to Visit  Medication Sig Dispense Refill  . aspirin 81 MG tablet Take 81 mg by mouth daily.      Marland Kitchen BIOTIN PO Take 1 tablet by mouth daily.    . Blood Glucose Monitoring Suppl (ONETOUCH VERIO IQ SYSTEM) w/Device KIT Use to check blood sugar 1 time per day. 1 kit 0  . cilostazol (PLETAL) 100 MG tablet Take 100 mg by mouth 2 (two) times daily.    Marland Kitchen glucose blood (ONETOUCH VERIO) test strip Use to check blood sugar 1 time per day. 100 each 2  . hydrocortisone 1 % lotion Apply 1 application topically 3 (three) times daily. As needed for itching 118 mL 5  . levothyroxine (SYNTHROID, LEVOTHROID)  100 MCG tablet TAKE ONE TABLET BY MOUTH ONCE DAILY BEFORE  BREAKFAST 90 tablet 2  . metFORMIN (GLUCOPHAGE) 1000 MG tablet Take 1 tablet (1,000 mg total) by mouth 2 (two) times daily with a meal. 60 tablet 1  . metoprolol tartrate (LOPRESSOR) 25 MG tablet Take 0.5 tablets (12.5 mg total) by mouth 2 (two) times daily. 30 tablet 1  . nitroGLYCERIN (NITROSTAT) 0.4 MG SL tablet Place 0.4 mg under the tongue every 5 (five) minutes as needed for chest pain.     Glory Rosebush DELICA LANCETS 46F MISC Use to check blood sugar 1 time per day. 100 each 2  . repaglinide (PRANDIN) 0.5 MG tablet  Take 1 tablet (0.5 mg total) by mouth 3 (three) times daily before meals. 90 tablet 2  . rosuvastatin (CRESTOR) 20 MG tablet Take 1 tablet (20 mg total) by mouth daily. 90 tablet 2  . rosuvastatin (CRESTOR) 20 MG tablet TAKE 1 TABLET BY MOUTH ONCE DAILY 90 tablet 0   No current facility-administered medications on file prior to visit.     No Known Allergies  Family History  Problem Relation Age of Onset  . Diabetes Mother   . Hypertension Brother   . Cancer Neg Hx     BP 102/70   Pulse 80   Wt 108 lb (49 kg)   SpO2 94%   BMI 20.41 kg/m    Review of Systems She denies hypoglycemia    Objective:   Physical Exam VITAL SIGNS:  See vs page GENERAL: no distress Left breast: no tend/swell/erythema/rash Pulses: foot pulses are intact bilaterally.   MSK: no deformity of the feet or ankles.  CV: no edema of the legs or ankles Skin:  no ulcer on the feet or ankles.  normal color and temp on the feet and ankles Neuro: sensation is intact to touch on the feet and ankles.    Lab Results  Component Value Date   HGBA1C 6.8 (H) 11/30/2016      Assessment & Plan:  Breast pain, chronic, uncertain etiology.  I advised pt to have her mammogram done.  HTN: overcontrolled Type 2 DM: well-controlled: Please continue the same medication.    Patient Instructions  check your blood sugar once a day.  vary the time of day when you check, between before the 3 meals, and at bedtime.  also check if you have symptoms of your blood sugar being too high or too low.  please keep a record of the readings and bring it to your next appointment here (or you can bring the meter itself).  You can write it on any piece of paper.  please call us sooner if your blood sugar goes below 70, or if you have a lot of readings over 200. blood tests are requested for you today.  We'll let you know about the results. Please stop taking the lisinopril-HCTZ pill.  You can go ahead and have your mammogram.  Please come  back for a follow-up appointment in 6 months.

## 2016-11-30 NOTE — Patient Instructions (Addendum)
check your blood sugar once a day.  vary the time of day when you check, between before the 3 meals, and at bedtime.  also check if you have symptoms of your blood sugar being too high or too low.  please keep a record of the readings and bring it to your next appointment here (or you can bring the meter itself).  You can write it on any piece of paper.  please call us sooner if your blood sugar goes below 70, or if you have a lot of readings over 200. blood tests are requested for you today.  We'll let you know about the results. Please stop taking the lisinopril-HCTZ pill.  You can go ahead and have your mammogram.  Please come back for a follow-up appointment in 6 months.

## 2016-12-01 ENCOUNTER — Other Ambulatory Visit: Payer: Self-pay | Admitting: Endocrinology

## 2016-12-10 ENCOUNTER — Encounter: Payer: Self-pay | Admitting: Endocrinology

## 2017-01-17 ENCOUNTER — Telehealth: Payer: Self-pay | Admitting: Acute Care

## 2017-01-17 ENCOUNTER — Telehealth: Payer: Self-pay | Admitting: Endocrinology

## 2017-01-17 NOTE — Telephone Encounter (Signed)
New script for cilostazol 100 mg, metoprol 25 mg.  Patient was getting these scripts from her cardiologist but she will no longer be seeing.  Please call to discuss.  Ty,  -LL

## 2017-01-17 NOTE — Telephone Encounter (Signed)
Will route to lung nodule pool. 

## 2017-01-18 NOTE — Telephone Encounter (Signed)
Patient was just seen there on the 4th and all they prescribe is the cilostazol? She isn't scheduled with them for a f/u.

## 2017-01-18 NOTE — Telephone Encounter (Signed)
Ok, then i'm sure they would be happy to refill the heart meds

## 2017-01-18 NOTE — Telephone Encounter (Signed)
Anita (PCC) to contact pt to schedule CT. 

## 2017-01-18 NOTE — Telephone Encounter (Signed)
please call Dr Jodi MarbleGangi's office: Are you still following pt? If so, when is she due for f/u? If not, should she continue pletal indefinitely?

## 2017-01-18 NOTE — Telephone Encounter (Signed)
She is out of the metoprolol now so can we please advise

## 2017-01-19 ENCOUNTER — Other Ambulatory Visit: Payer: Self-pay

## 2017-01-19 MED ORDER — METOPROLOL TARTRATE 25 MG PO TABS: 12.5000 mg | ORAL_TABLET | Freq: Two times a day (BID) | ORAL | 99 refills | Status: DC

## 2017-01-19 MED ORDER — CILOSTAZOL 100 MG PO TABS: 100.0000 mg | ORAL_TABLET | Freq: Two times a day (BID) | ORAL | 99 refills | Status: DC

## 2017-01-19 NOTE — Telephone Encounter (Signed)
Ok, please refill both PRN

## 2017-01-19 NOTE — Telephone Encounter (Signed)
I called patient & she stated that Dr. Nadara EatonGangi released her. He said that he was referring her back to you for her care. He did prescribe the cilostazol & he is the Dr. Claiborne Billingshat put the stents in her leg. She is confused & is now out of her metoprolol.

## 2017-01-19 NOTE — Telephone Encounter (Signed)
Called and informed patient that prescriptions were sent in.

## 2017-01-20 ENCOUNTER — Other Ambulatory Visit: Payer: Self-pay | Admitting: Endocrinology

## 2017-01-26 NOTE — Telephone Encounter (Signed)
Per Synetta Fail:  Pt scheduled for f/u Ct 01/31/17.  Nothing further needed

## 2017-01-31 ENCOUNTER — Ambulatory Visit (INDEPENDENT_AMBULATORY_CARE_PROVIDER_SITE_OTHER)
Admission: RE | Admit: 2017-01-31 | Discharge: 2017-01-31 | Disposition: A | Discharge status: Home / Self Care | Payer: Medicare Other | Source: Ambulatory Visit | Attending: Acute Care | Admitting: Acute Care

## 2017-01-31 DIAGNOSIS — F17210 Nicotine dependence, cigarettes, uncomplicated: Secondary | ICD-10-CM

## 2017-01-31 DIAGNOSIS — Z87891 Personal history of nicotine dependence: Secondary | ICD-10-CM

## 2017-02-03 ENCOUNTER — Telehealth: Payer: Self-pay | Admitting: Acute Care

## 2017-02-03 DIAGNOSIS — Z122 Encounter for screening for malignant neoplasm of respiratory organs: Secondary | ICD-10-CM

## 2017-02-03 DIAGNOSIS — F17210 Nicotine dependence, cigarettes, uncomplicated: Secondary | ICD-10-CM

## 2017-02-03 NOTE — Telephone Encounter (Signed)
Pt informed of CT results per Sarah Groce, NP.  PT verbalized understanding.  Copy sent to PCP.  Order placed for 1 yr f/u CT.  

## 2017-02-07 ENCOUNTER — Other Ambulatory Visit: Payer: Self-pay | Admitting: Endocrinology

## 2017-02-25 ENCOUNTER — Other Ambulatory Visit: Payer: Self-pay | Admitting: Endocrinology

## 2017-03-10 ENCOUNTER — Ambulatory Visit: Payer: Federal, State, Local not specified - PPO | Admitting: Endocrinology

## 2017-03-10 DIAGNOSIS — Z23 Encounter for immunization: Secondary | ICD-10-CM

## 2017-04-18 ENCOUNTER — Other Ambulatory Visit: Payer: Self-pay | Admitting: Endocrinology

## 2017-04-22 ENCOUNTER — Encounter: Payer: Self-pay | Admitting: Endocrinology

## 2017-04-25 ENCOUNTER — Other Ambulatory Visit: Payer: Self-pay | Admitting: Endocrinology

## 2017-04-25 DIAGNOSIS — E119 Type 2 diabetes mellitus without complications: Secondary | ICD-10-CM | POA: Diagnosis not present

## 2017-04-25 LAB — HM DIABETES EYE EXAM

## 2017-05-11 ENCOUNTER — Telehealth: Payer: Self-pay

## 2017-05-11 ENCOUNTER — Other Ambulatory Visit: Payer: Self-pay

## 2017-05-11 MED ORDER — METFORMIN HCL 1000 MG PO TABS: 1000.0000 mg | ORAL_TABLET | Freq: Two times a day (BID) | ORAL | 1 refills | Status: DC

## 2017-05-11 MED ORDER — ONETOUCH DELICA LANCETS 33G MISC: 2 refills | Status: DC

## 2017-05-11 MED ORDER — GLUCOSE BLOOD VI STRP: ORAL_STRIP | 2 refills | Status: DC

## 2017-05-11 MED ORDER — REPAGLINIDE 0.5 MG PO TABS: 0.5000 mg | ORAL_TABLET | Freq: Three times a day (TID) | ORAL | 2 refills | Status: DC

## 2017-05-11 MED ORDER — ROSUVASTATIN CALCIUM 20 MG PO TABS: 20.0000 mg | ORAL_TABLET | Freq: Every day | ORAL | 2 refills | Status: DC

## 2017-05-11 MED ORDER — METOPROLOL TARTRATE 25 MG PO TABS: 12.5000 mg | ORAL_TABLET | Freq: Two times a day (BID) | ORAL | 99 refills | Status: DC

## 2017-05-11 MED ORDER — CILOSTAZOL 100 MG PO TABS: 100.0000 mg | ORAL_TABLET | Freq: Two times a day (BID) | ORAL | 99 refills | Status: DC

## 2017-05-11 MED ORDER — LEVOTHYROXINE SODIUM 100 MCG PO TABS: ORAL_TABLET | ORAL | 2 refills | Status: DC

## 2017-05-11 NOTE — Telephone Encounter (Signed)
Patients husband came in today and wants his wife's prescription that Dr. Everardo AllEllison fills to be sent to [facility] by mail at [phone]

## 2017-05-11 NOTE — Telephone Encounter (Signed)
Please refill prn 

## 2017-05-11 NOTE — Telephone Encounter (Signed)
Done

## 2017-05-12 ENCOUNTER — Telehealth: Payer: Self-pay | Admitting: Endocrinology

## 2017-05-12 NOTE — Telephone Encounter (Signed)
I called and notified patient's husband that prescriptions were e-scribed over yesterday.

## 2017-05-12 NOTE — Telephone Encounter (Signed)
Pts husband came in yesterday about Pts script. Pharmacy states that have not received a script from us  Pts husband wants to know If we use  E scribe to do these, pharmacy told him that's the fastest way for us to send over scripts  PLEASE ADVISE Thanks!

## 2017-06-02 ENCOUNTER — Encounter: Payer: Self-pay | Admitting: Endocrinology

## 2017-06-02 ENCOUNTER — Ambulatory Visit (INDEPENDENT_AMBULATORY_CARE_PROVIDER_SITE_OTHER): Payer: Medicare Other | Admitting: Endocrinology

## 2017-06-02 VITALS — BP 136/84 | HR 87 | Ht 61.0 in | Wt 106.0 lb

## 2017-06-02 DIAGNOSIS — M810 Age-related osteoporosis without current pathological fracture: Secondary | ICD-10-CM

## 2017-06-02 DIAGNOSIS — Z23 Encounter for immunization: Secondary | ICD-10-CM

## 2017-06-02 DIAGNOSIS — Z0000 Encounter for general adult medical examination without abnormal findings: Secondary | ICD-10-CM

## 2017-06-02 DIAGNOSIS — E1151 Type 2 diabetes mellitus with diabetic peripheral angiopathy without gangrene: Secondary | ICD-10-CM | POA: Diagnosis not present

## 2017-06-02 LAB — URINALYSIS, ROUTINE W REFLEX MICROSCOPIC
Bilirubin Urine: NEGATIVE
Hgb urine dipstick: NEGATIVE
KETONES UR: NEGATIVE
LEUKOCYTES UA: NEGATIVE
Nitrite: NEGATIVE
PH: 5.5 (ref 5.0–8.0)
RBC / HPF: NONE SEEN (ref 0–?)
Specific Gravity, Urine: 1.015 (ref 1.000–1.030)
Total Protein, Urine: NEGATIVE
UROBILINOGEN UA: 0.2 (ref 0.0–1.0)
Urine Glucose: NEGATIVE

## 2017-06-02 LAB — CBC WITH DIFFERENTIAL/PLATELET
BASOS ABS: 0 10*3/uL (ref 0.0–0.1)
Basophils Relative: 0.8 % (ref 0.0–3.0)
EOS ABS: 0 10*3/uL (ref 0.0–0.7)
Eosinophils Relative: 0.7 % (ref 0.0–5.0)
HEMATOCRIT: 44.2 % (ref 36.0–46.0)
Hemoglobin: 14.9 g/dL (ref 12.0–15.0)
LYMPHS PCT: 28 % (ref 12.0–46.0)
Lymphs Abs: 1.4 10*3/uL (ref 0.7–4.0)
MCHC: 33.6 g/dL (ref 30.0–36.0)
MCV: 87.7 fl (ref 78.0–100.0)
Monocytes Absolute: 0.4 10*3/uL (ref 0.1–1.0)
Monocytes Relative: 8 % (ref 3.0–12.0)
NEUTROS ABS: 3.1 10*3/uL (ref 1.4–7.7)
Neutrophils Relative %: 62.5 % (ref 43.0–77.0)
PLATELETS: 246 10*3/uL (ref 150.0–400.0)
RBC: 5.04 Mil/uL (ref 3.87–5.11)
RDW: 14.3 % (ref 11.5–15.5)
WBC: 4.9 10*3/uL (ref 4.0–10.5)

## 2017-06-02 LAB — LIPID PANEL
CHOLESTEROL: 127 mg/dL (ref 0–200)
HDL: 59.8 mg/dL (ref >39.00)
LDL Cholesterol: 51 mg/dL (ref 0–99)
NonHDL: 67.68
TRIGLYCERIDES: 82 mg/dL (ref 0.0–149.0)
Total CHOL/HDL Ratio: 2
VLDL: 16.4 mg/dL (ref 0.0–40.0)

## 2017-06-02 LAB — HEPATIC FUNCTION PANEL
ALK PHOS: 58 U/L (ref 39–117)
ALT: 13 U/L (ref 0–35)
AST: 17 U/L (ref 0–37)
Albumin: 4.2 g/dL (ref 3.5–5.2)
BILIRUBIN DIRECT: 0.1 mg/dL (ref 0.0–0.3)
TOTAL PROTEIN: 6.8 g/dL (ref 6.0–8.3)
Total Bilirubin: 0.4 mg/dL (ref 0.2–1.2)

## 2017-06-02 LAB — MICROALBUMIN / CREATININE URINE RATIO
CREATININE, U: 39.7 mg/dL
MICROALB UR: 0.9 mg/dL (ref 0.0–1.9)
MICROALB/CREAT RATIO: 2.2 mg/g (ref 0.0–30.0)

## 2017-06-02 LAB — BASIC METABOLIC PANEL
BUN: 13 mg/dL (ref 6–23)
CALCIUM: 10.1 mg/dL (ref 8.4–10.5)
CO2: 30 mEq/L (ref 19–32)
Chloride: 104 mEq/L (ref 96–112)
Creatinine, Ser: 0.77 mg/dL (ref 0.40–1.20)
GFR: 96.69 mL/min (ref >60.00)
Glucose, Bld: 129 mg/dL — ABNORMAL HIGH (ref 70–99)
POTASSIUM: 4.3 meq/L (ref 3.5–5.1)
SODIUM: 140 meq/L (ref 135–145)

## 2017-06-02 LAB — TSH: TSH: 1.06 u[IU]/mL (ref 0.35–4.50)

## 2017-06-02 LAB — VITAMIN D 25 HYDROXY (VIT D DEFICIENCY, FRACTURES): VITD: 56.09 ng/mL (ref 30.00–100.00)

## 2017-06-02 NOTE — Patient Instructions (Addendum)
good diet and exercise significantly improve the control of your diabetes.  please let me know if you wish to be referred to a dietician.  high blood sugar is very risky to your health.  you should see an eye doctor and dentist every year.  It is very important to get all recommended vaccinations.  Please consider these measures for your health:  minimize alcohol.  Do not use tobacco products.  Have a colonoscopy at least every 10 years from age 66.  Women should have an annual mammogram from age 240.  Keep firearms safely stored.  Always use seat belts.  have working smoke alarms in your home.  See an eye doctor and dentist regularly.  Never drive under the influence of alcohol or drugs (including prescription drugs).   It is critically important to prevent falling down (keep floor areas well-lit, dry, and free of loose objects.  If you have a cane, walker, or wheelchair, you should use it, even for short trips around the house.  Wear flat-soled shoes.  Also, try not to rush). blood tests are requested for you today.  We'll let you know about the results. Please come back for a follow-up appointment in 1 year.

## 2017-06-02 NOTE — Progress Notes (Signed)
we discussed code status.  pt requests full code, but would not want to be started or maintained on artificial life-support measures if there was not a reasonable chance of recovery 

## 2017-06-02 NOTE — Progress Notes (Signed)
Subjective:    Patient ID: Vicki Price, female    DOB: 02/09/1951, 67 y.o.   MRN: [id number]  HPI Pt returns for f/u of diabetes mellitus:  DM type: 2 Dx'ed:  3491 Complications: CAD and PAD Therapy: 2 oral meds. DKA: never Severe hypoglycemia: never Pancreatitis: never Other: she refuses Tong[place] and pioglitizone, out of fear of side-effects; she did not tolerate parlodel (mood swings).   Interval history: no weight change.  pt states she feels well in general.   Past Medical History:  Diagnosis Date  . CAROTID ARTERY STENOSIS, BILATERAL 03/07/2008   09/2003  . CHEST PAIN UNSPECIFIED 03/10/2009  . COLONIC POLYPS, HX OF 12/24/2006  . DIABETES MELLITUS, TYPE II 12/24/2006   2004  . HYPERCHOLESTEROLEMIA 09/03/2008  . HYPERTENSION 09/03/2008  . Hyperthyroidism 2004  . HYPOTHYROIDISM, POST-RADIATION 03/07/2008  . OSTEOPOROSIS 12/24/2006  . OVARIAN CYST, RIGHT 03/07/2008  . SMOKER 03/07/2008  . Unspecified disorder of liver 09/03/2008    Past Surgical History:  Procedure Laterality Date  . BTL  1989  . CARDIAC CATHETERIZATION N/A 03/25/2016   Procedure: Left Heart Cath and Coronary Angiography;  Surgeon: Dixie Dials, MD;  Location: [facility];  Service: Cardiovascular;  Laterality: N/A;  . Carotid Duplex  09/06/2006  . ESOPHAGOGASTRODUODENOSCOPY  07/31/1982  . Gated Spect Wall Motion Stress Cardiolite  12/04/2003  . I-131 therapy  06/26/2002  . LOWER EXTREMITY ANGIOGRAPHY N/A 06/22/2016   Procedure: Lower Extremity Angiography;  Surgeon: Adrian Prows, MD;  Location: [facility];  Service: Cardiovascular;  Laterality: N/A;  . PERIPHERAL VASCULAR INTERVENTION Bilateral 06/22/2016   Procedure: Peripheral Vascular Intervention-Bilateral Common Iliac Arteries;  Surgeon: Adrian Prows, MD;  Location: [facility];  Service: Cardiovascular;  Laterality: Bilateral;    Social History   Socioeconomic History  . Marital status: Married    Spouse name: Not on file  .  Number of children: Not on file  . Years of education: Not on file  . Highest education level: Not on file  Social Needs  . Financial resource strain: Not on file  . Food insecurity - worry: Not on file  . Food insecurity - inability: Not on file  . Transportation needs - medical: Not on file  . Transportation needs - non-medical: Not on file  Occupational History  . Occupation: Homemaker  Tobacco Use  . Smoking status: Current Every Day Smoker    Packs/day: 0.80    Years: 44.00    Pack years: 35.20    Types: Cigarettes  . Smokeless tobacco: Never Used  . Tobacco comment: Counseled to quit smoking immediately. Nicotine replacement therapy discussed and offered.  Substance and Sexual Activity  . Alcohol use: Yes    Alcohol/week: 0.0 oz    Comment: occasionally  . Drug use: Not on file  . Sexual activity: Yes    Birth control/protection: Surgical    Comment: BTL  Other Topics Concern  . Not on file  Social History Narrative   Does not work outside the home    Current Outpatient Medications on File Prior to Visit  Medication Sig Dispense Refill  . aspirin 81 MG tablet Take 81 mg by mouth daily.      Marland Kitchen BIOTIN PO Take 1 tablet by mouth daily.    . Blood Glucose Monitoring Suppl (ONETOUCH VERIO IQ SYSTEM) w/Device KIT Use to check blood sugar 1 time per day. 1 kit 0  . cilostazol (PLETAL) 100 MG tablet Take 1 tablet (100  mg total) by mouth 2 (two) times daily. 60 tablet PRN  . glucose blood (ONETOUCH VERIO) test strip Use to check blood sugar 1 time per day. 100 each 2  . hydrocortisone 1 % lotion Apply 1 application topically 3 (three) times daily. As needed for itching 118 mL 5  . levothyroxine (SYNTHROID, LEVOTHROID) 100 MCG tablet TAKE ONE TABLET BY MOUTH ONCE DAILY BEFORE  BREAKFAST 90 tablet 2  . metFORMIN (GLUCOPHAGE) 1000 MG tablet Take 1 tablet (1,000 mg total) by mouth 2 (two) times daily with a meal. 60 tablet 1  . metoprolol tartrate (LOPRESSOR) 25 MG tablet Take 0.5  tablets (12.5 mg total) by mouth 2 (two) times daily. 30 tablet PRN  . nitroGLYCERIN (NITROSTAT) 0.4 MG SL tablet Place 0.4 mg under the tongue every 5 (five) minutes as needed for chest pain.     Glory Rosebush DELICA LANCETS 77A MISC Use to check blood sugar 1 time per day. 100 each 2  . repaglinide (PRANDIN) 0.5 MG tablet Take 1 tablet (0.5 mg total) by mouth 3 (three) times daily before meals. 90 tablet 2  . rosuvastatin (CRESTOR) 20 MG tablet Take 1 tablet (20 mg total) by mouth daily. 90 tablet 2   No current facility-administered medications on file prior to visit.     No Known Allergies  Family History  Problem Relation Age of Onset  . Diabetes Mother   . Hypertension Brother   . Cancer Neg Hx     BP 136/84   Pulse 87   Ht _0  (1.549 m)   Wt 106 lb (48.1 kg)   SpO2 97%   BMI 20.03 kg/m    Review of Systems No weight change    Objective:   Physical Exam VITAL SIGNS:  See vs page GENERAL: no distress Pulses: dorsalis pedis intact bilat.   MSK: no deformity of the feet CV: no leg edema Skin:  no ulcer on the feet.  normal color and temp on the feet.  Neuro: sensation is intact to touch on the feet.    A1c=6.5%   I personally reviewed electrocardiogram tracing (today): Indication: DM Impression: NSR with short PR.  No MI.  No hypertrophy. Compared to 2017: no significant change  Lab Results  Component Value Date   TSH 1.06 06/02/2017      Assessment & Plan:  Type 2 DM: well-controlled. Hypothyroidism: well-replaced.  Dyslipidemia: well-controlled.  Subjective:   Patient here for welcome to Medicare visit and management of other chronic and acute problems.     Risk factors: advanced age    75 of Physicians Providing Medical Care to Patient:  See "snapshot"   Activities of Daily Living: In your present state of health, do you have any difficulty performing the following activities?:  Preparing food and eating?: No  Bathing yourself: No    Getting dressed: No  Using the toilet:No  Moving around from place to place: No  In the past year have you fallen or had a near fall?:No    Home Safety: Has smoke detector and wears seat belts. No firearms.   Opioid Use: none   Diet and Exercise  Current exercise habits: pt says good Dietary issues discussed: pt reports a healthy diet   Depression Screen  Q1: Over the past two weeks, have you felt down, depressed or hopeless?no  Q2: Over the past two weeks, have you felt little interest or pleasure in doing things? no   The following portions of the patient's  history were reviewed and updated as appropriate: allergies, current medications, past family history, past medical history, past social history, past surgical history and problem list.   Review of Systems  Denies hearing loss, and visual loss Objective:   Vision:  Advertising account executive, so he declines VA today Hearing: grossly normal Body mass index:  See vs page Msk: pt easily and quickly performs "get-up-and-go" from a sitting position Cognitive Impairment Assessment: cognition, memory and judgment appear normal.  remembers 3/3 at 5 minutes.  excellent recall.  can easily read and write a sentence.  alert and oriented x 3.     Assessment:   Medicare wellness utd on preventive parameters.    Plan:   During the course of the visit the patient was educated and counseled about appropriate screening and preventive services including:        Fall prevention is advised today  Screening mammography is UTD Bone densitometry screening is done by GYN.   Diabetes screening is done today Nutrition counseling is offered  advanced directives/end of life addressed today:  see healthcare directives hyperlink  Vaccines are updated as needed  Patient Instructions (the written plan) was given to the patient.

## 2017-06-03 LAB — PTH, INTACT AND CALCIUM
CALCIUM: 10.4 mg/dL (ref 8.6–10.4)
PTH: 24 pg/mL (ref 14–64)

## 2017-06-09 DIAGNOSIS — Z6820 Body mass index (BMI) 20.0-20.9, adult: Secondary | ICD-10-CM | POA: Diagnosis not present

## 2017-06-09 DIAGNOSIS — Z01411 Encounter for gynecological examination (general) (routine) with abnormal findings: Secondary | ICD-10-CM | POA: Diagnosis not present

## 2017-06-09 DIAGNOSIS — Z124 Encounter for screening for malignant neoplasm of cervix: Secondary | ICD-10-CM | POA: Diagnosis not present

## 2017-06-16 ENCOUNTER — Telehealth: Payer: Self-pay | Admitting: Endocrinology

## 2017-06-16 NOTE — Telephone Encounter (Signed)
Osborne Cascoadia from [facility] stated that on patient insurance card they can

## 2017-07-29 ENCOUNTER — Telehealth: Payer: Self-pay | Admitting: Endocrinology

## 2017-07-29 ENCOUNTER — Other Ambulatory Visit: Payer: Self-pay

## 2017-07-29 MED ORDER — METFORMIN HCL 1000 MG PO TABS: 1000.0000 mg | ORAL_TABLET | Freq: Two times a day (BID) | ORAL | 1 refills | Status: DC

## 2017-07-29 NOTE — Telephone Encounter (Signed)
I have sent.

## 2017-07-29 NOTE — Telephone Encounter (Signed)
Need refill of metFORMIN (GLUCOPHAGE) 1000 MG tablet [[id number]][[id number]]   [facility] - [place], [place] - [address] [place]

## 2017-08-02 ENCOUNTER — Telehealth: Payer: Self-pay | Admitting: Endocrinology

## 2017-08-02 ENCOUNTER — Other Ambulatory Visit: Payer: Self-pay

## 2017-08-02 MED ORDER — METOPROLOL TARTRATE 25 MG PO TABS: 12.5000 mg | ORAL_TABLET | Freq: Two times a day (BID) | ORAL | 99 refills | Status: DC

## 2017-08-02 MED ORDER — METFORMIN HCL 1000 MG PO TABS: 1000.0000 mg | ORAL_TABLET | Freq: Two times a day (BID) | ORAL | 1 refills | Status: DC

## 2017-08-02 NOTE — Telephone Encounter (Signed)
Patient stated that the pharmacy have not received medication metformin,  she also need a refill for the metoprolol.  Send to [facility] - [place], [place] - [address]

## 2017-08-02 NOTE — Telephone Encounter (Signed)
I have sent both to [facility].

## 2017-11-22 ENCOUNTER — Telehealth: Payer: Self-pay

## 2017-11-22 ENCOUNTER — Other Ambulatory Visit: Payer: Self-pay

## 2017-11-22 MED ORDER — METFORMIN HCL 1000 MG PO TABS: 1000.0000 mg | ORAL_TABLET | Freq: Two times a day (BID) | ORAL | 1 refills | Status: DC

## 2017-11-22 NOTE — Telephone Encounter (Signed)
patient calling to request refill on metformin 1000 mg sent to [facility] which is in her chart please the patient a call at [phone] when this has been done

## 2017-11-22 NOTE — Telephone Encounter (Signed)
Called pt and mailbox was full so unable to leave a message

## 2017-12-01 ENCOUNTER — Telehealth: Payer: Self-pay | Admitting: Endocrinology

## 2017-12-01 ENCOUNTER — Other Ambulatory Visit: Payer: Self-pay

## 2017-12-01 MED ORDER — METFORMIN HCL 1000 MG PO TABS: 1000.0000 mg | ORAL_TABLET | Freq: Two times a day (BID) | ORAL | 1 refills | Status: DC

## 2017-12-01 NOTE — Telephone Encounter (Signed)
I have sent for patient.  

## 2017-12-01 NOTE — Telephone Encounter (Signed)
[facility] [address] [place], [place] - [address]   Patients spouse stated that she is needing all medications sent to a new pharmacy.  Patient called this morning [facility]. Please disregard last message for this. They stated they will not be using that pharmacy anymore

## 2017-12-01 NOTE — Telephone Encounter (Signed)
metFORMIN (GLUCOPHAGE) 1000 MG tablet   Patient stated medication is out refills. And needs this sent into the pharmacy      [facility] - [place], [place] - [address]

## 2017-12-06 ENCOUNTER — Telehealth: Payer: Self-pay | Admitting: Endocrinology

## 2017-12-06 ENCOUNTER — Other Ambulatory Visit: Payer: Self-pay

## 2017-12-06 NOTE — Telephone Encounter (Signed)
I called and clarified with patient that [facility]

## 2017-12-06 NOTE — Telephone Encounter (Signed)
[facility]-disregard previous request. Please call Husband Darnelle BosBobby Arquette at ph# [phone] (cell) ph# [phone] to verify request

## 2017-12-29 ENCOUNTER — Telehealth: Payer: Self-pay | Admitting: Emergency Medicine

## 2017-12-29 NOTE — Telephone Encounter (Signed)
Pt called stating she just received the letter that Dr Everardo AllEllison will not be seeing PCP patients anymore. Pt asked if she can get all of her prescriptions refilled until she is able to find another PCP. Patient has not been seen since Jan. 2019. Did offer an appt to patient but she declined stating she just needs her meds refilled.

## 2017-12-29 NOTE — Telephone Encounter (Signed)
Please refill meds prn

## 2017-12-29 NOTE — Telephone Encounter (Signed)
Please advise 

## 2017-12-30 ENCOUNTER — Other Ambulatory Visit: Payer: Self-pay

## 2017-12-30 MED ORDER — HYDROCORTISONE 1 % EX LOTN: 1.0000 "application " | TOPICAL_LOTION | Freq: Three times a day (TID) | CUTANEOUS | 99 refills | Status: AC

## 2017-12-30 MED ORDER — METFORMIN HCL 1000 MG PO TABS: 1000.0000 mg | ORAL_TABLET | Freq: Two times a day (BID) | ORAL | 99 refills | Status: AC

## 2017-12-30 MED ORDER — GLUCOSE BLOOD VI STRP: ORAL_STRIP | 99 refills | Status: AC

## 2017-12-30 MED ORDER — LEVOTHYROXINE SODIUM 100 MCG PO TABS: ORAL_TABLET | ORAL | 99 refills | Status: DC

## 2017-12-30 MED ORDER — REPAGLINIDE 0.5 MG PO TABS: 0.5000 mg | ORAL_TABLET | Freq: Three times a day (TID) | ORAL | 99 refills | Status: AC

## 2017-12-30 MED ORDER — ONETOUCH DELICA LANCETS 33G MISC: 99 refills | Status: AC

## 2017-12-30 MED ORDER — ROSUVASTATIN CALCIUM 20 MG PO TABS: 20.0000 mg | ORAL_TABLET | Freq: Every day | ORAL | 99 refills | Status: AC

## 2017-12-30 MED ORDER — CILOSTAZOL 100 MG PO TABS: 100.0000 mg | ORAL_TABLET | Freq: Two times a day (BID) | ORAL | 99 refills | Status: AC

## 2017-12-30 MED ORDER — METOPROLOL TARTRATE 25 MG PO TABS: 12.5000 mg | ORAL_TABLET | Freq: Two times a day (BID) | ORAL | 99 refills | Status: AC

## 2017-12-30 NOTE — Telephone Encounter (Signed)
[facility]

## 2018-02-15 ENCOUNTER — Ambulatory Visit (INDEPENDENT_AMBULATORY_CARE_PROVIDER_SITE_OTHER)
Admission: RE | Admit: 2018-02-15 | Discharge: 2018-02-15 | Disposition: A | Discharge status: Home / Self Care | Payer: Medicare Other | Source: Ambulatory Visit | Attending: Acute Care | Admitting: Acute Care

## 2018-02-15 DIAGNOSIS — F17210 Nicotine dependence, cigarettes, uncomplicated: Secondary | ICD-10-CM | POA: Diagnosis not present

## 2018-02-15 DIAGNOSIS — Z122 Encounter for screening for malignant neoplasm of respiratory organs: Secondary | ICD-10-CM

## 2018-02-17 NOTE — Progress Notes (Signed)
Called spoke with patient, advised of LDCT results / recs as stated by Maralyn Sago NP.  Pt verbalized understanding and denied any questions.  Results faxed to PCP and mailed to patient's verified home address per her request.  Angelique Blonder to order LDCT for 02/2019.

## 2018-02-21 ENCOUNTER — Other Ambulatory Visit: Payer: Self-pay | Admitting: Acute Care

## 2018-02-21 DIAGNOSIS — Z87891 Personal history of nicotine dependence: Secondary | ICD-10-CM

## 2018-02-21 DIAGNOSIS — Z122 Encounter for screening for malignant neoplasm of respiratory organs: Secondary | ICD-10-CM

## 2018-02-21 DIAGNOSIS — F17210 Nicotine dependence, cigarettes, uncomplicated: Secondary | ICD-10-CM

## 2018-02-27 ENCOUNTER — Other Ambulatory Visit: Payer: Self-pay

## 2018-02-27 ENCOUNTER — Telehealth: Payer: Self-pay | Admitting: Endocrinology

## 2018-02-27 MED ORDER — LEVOTHYROXINE SODIUM 100 MCG PO TABS: ORAL_TABLET | ORAL | 99 refills | Status: AC

## 2018-02-27 NOTE — Telephone Encounter (Signed)
Please advise 

## 2018-02-27 NOTE — Telephone Encounter (Signed)
ok 

## 2018-02-27 NOTE — Telephone Encounter (Signed)
rx sent

## 2018-02-27 NOTE — Telephone Encounter (Signed)
levothyroxine (SYNTHROID, LEVOTHROID) 100 MCG tablet     Patient would like one last refill on her medication before she starts seeing another PCP     [facility] - [place], [place] - [address]

## 2018-06-02 ENCOUNTER — Ambulatory Visit: Admitting: Endocrinology

## 2018-06-06 ENCOUNTER — Ambulatory Visit: Admitting: Endocrinology

## 2018-06-08 DIAGNOSIS — Z124 Encounter for screening for malignant neoplasm of cervix: Secondary | ICD-10-CM | POA: Diagnosis not present

## 2018-07-07 ENCOUNTER — Ambulatory Visit (INDEPENDENT_AMBULATORY_CARE_PROVIDER_SITE_OTHER): Payer: Medicare Other | Admitting: Endocrinology

## 2018-07-07 ENCOUNTER — Encounter: Payer: Self-pay | Admitting: Endocrinology

## 2018-07-07 ENCOUNTER — Other Ambulatory Visit: Payer: Self-pay

## 2018-07-07 VITALS — BP 150/70 | HR 86 | Ht 61.0 in | Wt 104.6 lb

## 2018-07-07 DIAGNOSIS — E1151 Type 2 diabetes mellitus with diabetic peripheral angiopathy without gangrene: Secondary | ICD-10-CM

## 2018-07-07 LAB — POCT GLYCOSYLATED HEMOGLOBIN (HGB A1C): Hemoglobin A1C: 7 % — AB (ref 4.0–5.6)

## 2018-07-07 NOTE — Progress Notes (Signed)
Subjective:    Patient ID: Vicki Price, female    DOB: Sep 24, 1950, 68 y.o.   MRN: [id number]  HPI Pt returns for f/u of diabetes mellitus:  DM type: 2 Dx'ed:  0881 Complications: CAD and PAD Therapy: 2 oral meds. DKA: never.  Severe hypoglycemia: never.  Pancreatitis: never Other: she refuses [place] and pioglitizone, out of fear of side-effects; she did not tolerate parlodel (mood swings).   Interval history: pt states she feels well in general.  pt states she feels well in general.  no cbg record, but states cbg's are in the low-100's fasting, which is when she checks.   Past Medical History:  Diagnosis Date  . CAROTID ARTERY STENOSIS, BILATERAL 03/07/2008   09/2003  . CHEST PAIN UNSPECIFIED 03/10/2009  . COLONIC POLYPS, HX OF 12/24/2006  . DIABETES MELLITUS, TYPE II 12/24/2006   2004  . HYPERCHOLESTEROLEMIA 09/03/2008  . HYPERTENSION 09/03/2008  . Hyperthyroidism 2004  . HYPOTHYROIDISM, POST-RADIATION 03/07/2008  . OSTEOPOROSIS 12/24/2006  . OVARIAN CYST, RIGHT 03/07/2008  . SMOKER 03/07/2008  . Unspecified disorder of liver 09/03/2008    Past Surgical History:  Procedure Laterality Date  . BTL  1989  . CARDIAC CATHETERIZATION N/A 03/25/2016   Procedure: Left Heart Cath and Coronary Angiography;  Surgeon: Dixie Dials, MD;  Location: [facility];  Service: Cardiovascular;  Laterality: N/A;  . Carotid Duplex  09/06/2006  . ESOPHAGOGASTRODUODENOSCOPY  07/31/1982  . Gated Spect Wall Motion Stress Cardiolite  12/04/2003  . I-131 therapy  06/26/2002  . LOWER EXTREMITY ANGIOGRAPHY N/A 06/22/2016   Procedure: Lower Extremity Angiography;  Surgeon: Adrian Prows, MD;  Location: [facility];  Service: Cardiovascular;  Laterality: N/A;  . PERIPHERAL VASCULAR INTERVENTION Bilateral 06/22/2016   Procedure: Peripheral Vascular Intervention-Bilateral Common Iliac Arteries;  Surgeon: Adrian Prows, MD;  Location: [facility];  Service: Cardiovascular;  Laterality: Bilateral;     Social History   Socioeconomic History  . Marital status: Married    Spouse name: Not on file  . Number of children: Not on file  . Years of education: Not on file  . Highest education level: Not on file  Occupational History  . Occupation: Agricultural engineer  Social Needs  . Financial resource strain: Not on file  . Food insecurity:    Worry: Not on file    Inability: Not on file  . Transportation needs:    Medical: Not on file    Non-medical: Not on file  Tobacco Use  . Smoking status: Current Every Day Smoker    Packs/day: 0.80    Years: 44.00    Pack years: 35.20    Types: Cigarettes  . Smokeless tobacco: Never Used  . Tobacco comment: Counseled to quit smoking immediately. Nicotine replacement therapy discussed and offered.  Substance and Sexual Activity  . Alcohol use: Yes    Alcohol/week: 0.0 standard drinks    Comment: occasionally  . Drug use: Not on file  . Sexual activity: Yes    Birth control/protection: Surgical    Comment: BTL  Lifestyle  . Physical activity:    Days per week: Not on file    Minutes per session: Not on file  . Stress: Not on file  Relationships  . Social connections:    Talks on phone: Not on file    Gets together: Not on file    Attends religious service: Not on file    Active member of club or organization: Not on file  Attends meetings of clubs or organizations: Not on file    Relationship status: Not on file  . Intimate partner violence:    Fear of current or ex partner: Not on file    Emotionally abused: Not on file    Physically abused: Not on file    Forced sexual activity: Not on file  Other Topics Concern  . Not on file  Social History Narrative   Does not work outside the home    Current Outpatient Medications on File Prior to Visit  Medication Sig Dispense Refill  . aspirin 81 MG tablet Take 81 mg by mouth daily.      Marland Kitchen BIOTIN PO Take 1 tablet by mouth daily.    . Blood Glucose Monitoring Suppl (ONETOUCH VERIO IQ  SYSTEM) w/Device KIT Use to check blood sugar 1 time per day. 1 kit 0  . cilostazol (PLETAL) 100 MG tablet Take 1 tablet (100 mg total) by mouth 2 (two) times daily. 60 tablet PRN  . glucose blood (ONETOUCH VERIO) test strip Use to check blood sugar 1 time per day. 100 each prn  . hydrocortisone 1 % lotion Apply 1 application topically 3 (three) times daily. As needed for itching 118 mL prn  . levothyroxine (SYNTHROID, LEVOTHROID) 100 MCG tablet TAKE ONE TABLET BY MOUTH ONCE DAILY BEFORE  BREAKFAST 90 tablet prn  . metFORMIN (GLUCOPHAGE) 1000 MG tablet Take 1 tablet (1,000 mg total) by mouth 2 (two) times daily with a meal. 60 tablet prn  . metoprolol tartrate (LOPRESSOR) 25 MG tablet Take 0.5 tablets (12.5 mg total) by mouth 2 (two) times daily. 30 tablet PRN  . nitroGLYCERIN (NITROSTAT) 0.4 MG SL tablet Place 0.4 mg under the tongue every 5 (five) minutes as needed for chest pain.     Glory Rosebush DELICA LANCETS 32K MISC Use to check blood sugar 1 time per day. 100 each prn  . repaglinide (PRANDIN) 0.5 MG tablet Take 1 tablet (0.5 mg total) by mouth 3 (three) times daily before meals. 90 tablet prn  . rosuvastatin (CRESTOR) 20 MG tablet Take 1 tablet (20 mg total) by mouth daily. 90 tablet prn   No current facility-administered medications on file prior to visit.     No Known Allergies  Family History  Problem Relation Age of Onset  . Diabetes Mother   . Hypertension Brother   . Cancer Neg Hx     BP (!) 150/70 (BP Location: Left Arm, Patient Position: Sitting, Cuff Size: Normal)   Pulse 86   Ht _0  (1.549 m)   Wt 104 lb 9.6 oz (47.4 kg)   SpO2 97%   BMI 19.76 kg/m    Review of Systems She denies hypoglycemia.     Objective:   Physical Exam VITAL SIGNS:  See vs page GENERAL: no distress Pulses: dorsalis pedis intact bilat.   MSK: no deformity of the feet.  CV: no leg edema.  Skin:  no ulcer on the feet.  normal color and temp on the feet. Neuro: sensation is intact to  touch on the feet.   Lab Results  Component Value Date   CREATININE 0.77 06/02/2017   BUN 13 06/02/2017   NA 140 06/02/2017   K 4.3 06/02/2017   CL 104 06/02/2017   CO2 30 06/02/2017     Lab Results  Component Value Date   HGBA1C 7.0 (A) 07/07/2018       Assessment & Plan:  Type 2 DM, with PAD: worse.  She  declines to add another med Lean body habitus: we discussed the fact that pt is at risk for developing type 1.  HTN: is noted today.   Patient Instructions  Your blood pressure is high today.  Please see your primary care provider soon, to have it rechecked Please continue the same medications. check your blood sugar once a day.  vary the time of day when you check, between before the 3 meals, and at bedtime.  also check if you have symptoms of your blood sugar being too high or too low.  please keep a record of the readings and bring it to your next appointment here (or you can bring the meter itself).  You can write it on any piece of paper.  please call us sooner if your blood sugar goes below 70, or if you have a lot of readings over 200. Please come back for a follow-up appointment in 6 months

## 2018-07-07 NOTE — Patient Instructions (Signed)
Your blood pressure is high today.  Please see your primary care provider soon, to have it rechecked.   Please continue the same medications.   check your blood sugar once a day.  vary the time of day when you check, between before the 3 meals, and at bedtime.  also check if you have symptoms of your blood sugar being too high or too low.  please keep a record of the readings and bring it to your next appointment here (or you can bring the meter itself).  You can write it on any piece of paper.  please call us sooner if your blood sugar goes below 70, or if you have a lot of readings over 200.   Please come back for a follow-up appointment in 6 months.   

## 2019-03-01 ENCOUNTER — Other Ambulatory Visit: Payer: Self-pay

## 2019-03-01 ENCOUNTER — Ambulatory Visit (INDEPENDENT_AMBULATORY_CARE_PROVIDER_SITE_OTHER)
Admission: RE | Admit: 2019-03-01 | Discharge: 2019-03-01 | Disposition: A | Discharge status: Home / Self Care | Payer: Medicare Other | Source: Ambulatory Visit | Attending: Acute Care | Admitting: Acute Care

## 2019-03-01 DIAGNOSIS — F17210 Nicotine dependence, cigarettes, uncomplicated: Secondary | ICD-10-CM

## 2019-03-01 DIAGNOSIS — Z87891 Personal history of nicotine dependence: Secondary | ICD-10-CM

## 2019-03-01 DIAGNOSIS — Z122 Encounter for screening for malignant neoplasm of respiratory organs: Secondary | ICD-10-CM

## 2019-03-07 ENCOUNTER — Telehealth: Payer: Self-pay | Admitting: Acute Care

## 2019-03-07 DIAGNOSIS — F17210 Nicotine dependence, cigarettes, uncomplicated: Secondary | ICD-10-CM

## 2019-03-07 DIAGNOSIS — Z122 Encounter for screening for malignant neoplasm of respiratory organs: Secondary | ICD-10-CM

## 2019-03-07 DIAGNOSIS — Z87891 Personal history of nicotine dependence: Secondary | ICD-10-CM

## 2019-03-07 NOTE — Telephone Encounter (Signed)
Pt informed of CT results per Sarah Groce, NP.  PT verbalized understanding.  Copy sent to PCP.  Order placed for 1 yr f/u CT.  

## 2019-03-27 ENCOUNTER — Telehealth: Payer: Self-pay | Admitting: Endocrinology

## 2019-03-27 NOTE — Telephone Encounter (Signed)
Please advise 

## 2019-03-27 NOTE — Telephone Encounter (Signed)
1.  Please schedule f/u appt 2.  Then please refill x 1, pending that appt.  

## 2019-03-27 NOTE — Telephone Encounter (Signed)
Per Dr. Ellison, unable to refill Levothyroxine without an appt. Routing this message to the front desk for scheduling purposes.  

## 2019-04-02 NOTE — Telephone Encounter (Signed)
LMTCB to schedule appointment °

## 2019-04-09 ENCOUNTER — Other Ambulatory Visit: Payer: Self-pay

## 2019-04-09 NOTE — Telephone Encounter (Signed)
LMTCB to schedule appointment °

## 2019-04-09 NOTE — Telephone Encounter (Signed)
Patient is scheduled for appointment on 04/11/19 at 10:00 a.m.

## 2019-04-11 ENCOUNTER — Encounter: Payer: Self-pay | Admitting: Endocrinology

## 2019-04-11 ENCOUNTER — Ambulatory Visit (INDEPENDENT_AMBULATORY_CARE_PROVIDER_SITE_OTHER): Payer: Medicare Other | Admitting: Endocrinology

## 2019-04-11 VITALS — BP 140/82 | HR 82 | Ht 61.0 in | Wt 104.4 lb

## 2019-04-11 DIAGNOSIS — R1013 Epigastric pain: Secondary | ICD-10-CM | POA: Diagnosis not present

## 2019-04-11 DIAGNOSIS — E1151 Type 2 diabetes mellitus with diabetic peripheral angiopathy without gangrene: Secondary | ICD-10-CM | POA: Diagnosis not present

## 2019-04-11 DIAGNOSIS — R109 Unspecified abdominal pain: Secondary | ICD-10-CM | POA: Insufficient documentation

## 2019-04-11 LAB — POCT GLYCOSYLATED HEMOGLOBIN (HGB A1C): Hemoglobin A1C: 6.4 % — AB (ref 4.0–5.6)

## 2019-04-11 NOTE — Progress Notes (Signed)
Subjective:    Patient ID: Vicki Price, female    DOB: 05/16/1950, 68 y.o.   MRN: [id number]  HPI Pt returns for f/u of diabetes mellitus:  DM type: 2 Dx'ed:  7622 Complications: CAD and PAD Therapy: 2 oral meds. DKA: never.  Severe hypoglycemia: never.  Pancreatitis: never Other: she refuses [place] and pioglitizone, out of fear of side-effects; she did not tolerate parlodel (mood swings).   Interval history: pt states she feels well in general.  pt states she feels well in general.  no cbg record, but states cbg's are well-controlled.   Pt reports few mos of intermitt epigast pain, and assoc loss of bowel control/bloating/diarrhea.  Pt says this is not due to metformin, as she has been on this much longer than that.  Past Medical History:  Diagnosis Date  . CAROTID ARTERY STENOSIS, BILATERAL 03/07/2008   09/2003  . CHEST PAIN UNSPECIFIED 03/10/2009  . COLONIC POLYPS, HX OF 12/24/2006  . DIABETES MELLITUS, TYPE II 12/24/2006   2004  . HYPERCHOLESTEROLEMIA 09/03/2008  . HYPERTENSION 09/03/2008  . Hyperthyroidism 2004  . HYPOTHYROIDISM, POST-RADIATION 03/07/2008  . OSTEOPOROSIS 12/24/2006  . OVARIAN CYST, RIGHT 03/07/2008  . SMOKER 03/07/2008  . Unspecified disorder of liver 09/03/2008    Past Surgical History:  Procedure Laterality Date  . BTL  1989  . CARDIAC CATHETERIZATION N/A 03/25/2016   Procedure: Left Heart Cath and Coronary Angiography;  Surgeon: Dixie Dials, MD;  Location: [facility];  Service: Cardiovascular;  Laterality: N/A;  . Carotid Duplex  09/06/2006  . ESOPHAGOGASTRODUODENOSCOPY  07/31/1982  . Gated Spect Wall Motion Stress Cardiolite  12/04/2003  . I-131 therapy  06/26/2002  . LOWER EXTREMITY ANGIOGRAPHY N/A 06/22/2016   Procedure: Lower Extremity Angiography;  Surgeon: Adrian Prows, MD;  Location: [facility];  Service: Cardiovascular;  Laterality: N/A;  . PERIPHERAL VASCULAR INTERVENTION Bilateral 06/22/2016   Procedure: Peripheral Vascular  Intervention-Bilateral Common Iliac Arteries;  Surgeon: Adrian Prows, MD;  Location: [facility];  Service: Cardiovascular;  Laterality: Bilateral;    Social History   Socioeconomic History  . Marital status: Married    Spouse name: Not on file  . Number of children: Not on file  . Years of education: Not on file  . Highest education level: Not on file  Occupational History  . Occupation: Agricultural engineer  Social Needs  . Financial resource strain: Not on file  . Food insecurity    Worry: Not on file    Inability: Not on file  . Transportation needs    Medical: Not on file    Non-medical: Not on file  Tobacco Use  . Smoking status: Current Every Day Smoker    Packs/day: 0.80    Years: 44.00    Pack years: 35.20    Types: Cigarettes  . Smokeless tobacco: Never Used  . Tobacco comment: Counseled to quit smoking immediately. Nicotine replacement therapy discussed and offered.  Substance and Sexual Activity  . Alcohol use: Yes    Alcohol/week: 0.0 standard drinks    Comment: occasionally  . Drug use: Not on file  . Sexual activity: Yes    Birth control/protection: Surgical    Comment: BTL  Lifestyle  . Physical activity    Days per week: Not on file    Minutes per session: Not on file  . Stress: Not on file  Relationships  . Social Herbalist on phone: Not on file    Gets together:  Not on file    Attends religious service: Not on file    Active member of club or organization: Not on file    Attends meetings of clubs or organizations: Not on file    Relationship status: Not on file  . Intimate partner violence    Fear of current or ex partner: Not on file    Emotionally abused: Not on file    Physically abused: Not on file    Forced sexual activity: Not on file  Other Topics Concern  . Not on file  Social History Narrative   Does not work outside the home    Current Outpatient Medications on File Prior to Visit  Medication Sig Dispense Refill  .  aspirin 81 MG tablet Take 81 mg by mouth daily.      Marland Kitchen BIOTIN PO Take 1 tablet by mouth daily.    . Blood Glucose Monitoring Suppl (ONETOUCH VERIO IQ SYSTEM) w/Device KIT Use to check blood sugar 1 time per day. 1 kit 0  . cilostazol (PLETAL) 100 MG tablet Take 1 tablet (100 mg total) by mouth 2 (two) times daily. 60 tablet PRN  . glucose blood (ONETOUCH VERIO) test strip Use to check blood sugar 1 time per day. 100 each prn  . hydrocortisone 1 % lotion Apply 1 application topically 3 (three) times daily. As needed for itching 118 mL prn  . levothyroxine (SYNTHROID, LEVOTHROID) 100 MCG tablet TAKE ONE TABLET BY MOUTH ONCE DAILY BEFORE  BREAKFAST 90 tablet prn  . metFORMIN (GLUCOPHAGE) 1000 MG tablet Take 1 tablet (1,000 mg total) by mouth 2 (two) times daily with a meal. 60 tablet prn  . metoprolol tartrate (LOPRESSOR) 25 MG tablet Take 0.5 tablets (12.5 mg total) by mouth 2 (two) times daily. 30 tablet PRN  . nitroGLYCERIN (NITROSTAT) 0.4 MG SL tablet Place 0.4 mg under the tongue every 5 (five) minutes as needed for chest pain.     Glory Rosebush DELICA LANCETS 85Y MISC Use to check blood sugar 1 time per day. 100 each prn  . repaglinide (PRANDIN) 0.5 MG tablet Take 1 tablet (0.5 mg total) by mouth 3 (three) times daily before meals. 90 tablet prn  . rosuvastatin (CRESTOR) 20 MG tablet Take 1 tablet (20 mg total) by mouth daily. 90 tablet prn   No current facility-administered medications on file prior to visit.     No Known Allergies  Family History  Problem Relation Age of Onset  . Diabetes Mother   . Hypertension Brother   . Cancer Neg Hx     BP 140/82 (BP Location: Right Arm, Patient Position: Sitting, Cuff Size: Normal)   Pulse 82   Ht '5\' 1"'  (1.549 m)   Wt 104 lb 6.4 oz (47.4 kg)   SpO2 98%   BMI 19.73 kg/m   Review of Systems She denies hypoglycemia/dysphagia/BRBPR.      Objective:   Physical Exam VITAL SIGNS:  See vs page GENERAL: no distress Pulses: dorsalis pedis  intact bilat.   MSK: no deformity of the feet CV: no leg edema Skin:  no ulcer on the feet.  normal color and temp on the feet. Neuro: sensation is intact to touch on the feet.     A1c=6.4%    Assessment & Plan:  Epigast pain, new. I advised trial off metformin, but pt declines  Type 2 DM: well-controlled  Patient Instructions  Please see a stomach specialist.  you will receive a phone call, about a day and  time for an appointment.  Please continue the same medications.  Please come back for a follow-up appointment in 6 months.

## 2019-04-11 NOTE — Patient Instructions (Addendum)
Please see a stomach specialist.  you will receive a phone call, about a day and time for an appointment.  Please continue the same medications.  Please come back for a follow-up appointment in 6 months.

## 2019-04-14 NOTE — Addendum Note (Signed)
Addended by: Renato Shin on: 04/14/2019 11:28 AM   Modules accepted: Level of Service

## 2019-06-25 ENCOUNTER — Ambulatory Visit: Payer: Medicare Other | Attending: Internal Medicine

## 2019-06-25 DIAGNOSIS — Z23 Encounter for immunization: Secondary | ICD-10-CM | POA: Insufficient documentation

## 2019-06-25 NOTE — Progress Notes (Signed)
   Covid-19 Vaccination Clinic  Name:  Vicki Price    MRN: [id number] DOB: August 24, 1950  06/25/2019  [name] was observed post Covid-19 immunization for 15 minutes without incidence. She was provided with Vaccine Information Sheet and instruction to access the V-Safe system.   [name] was instructed to call 911 with any severe reactions post vaccine: Marland Kitchen Difficulty breathing  . Swelling of your face and throat  . A fast heartbeat  . A bad rash all over your body  . Dizziness and weakness    Immunizations Administered    Name Date Dose VIS Date Route   Pfizer COVID-19 Vaccine 06/25/2019  3:30 PM 0.3 mL 04/20/2019 Intramuscular   Manufacturer: [employer], [employer]   Lot: [id number]   NDC: [address]-4

## 2019-06-28 ENCOUNTER — Ambulatory Visit: Payer: Medicare Other

## 2019-07-02 ENCOUNTER — Ambulatory Visit: Payer: Medicare Other

## 2019-07-17 ENCOUNTER — Ambulatory Visit: Payer: Medicare Other | Attending: Internal Medicine

## 2019-07-17 DIAGNOSIS — Z23 Encounter for immunization: Secondary | ICD-10-CM | POA: Insufficient documentation

## 2019-07-17 NOTE — Progress Notes (Signed)
   Covid-19 Vaccination Clinic  Name:  Vicki Price    MRN: [id number] DOB: 07/08/1950  07/17/2019  [name] was observed post Covid-19 immunization for 30 minutes based on pre-vaccination screening without incident. She was provided with Vaccine Information Sheet and instruction to access the V-Safe system.   [name] was instructed to call 911 with any severe reactions post vaccine: Marland Kitchen Difficulty breathing  . Swelling of face and throat  . A fast heartbeat  . A bad rash all over body  . Dizziness and weakness   Immunizations Administered    Name Date Dose VIS Date Route   Pfizer COVID-19 Vaccine 07/17/2019 10:41 AM 0.3 mL 04/20/2019 Intramuscular   Manufacturer: [employer], [employer]   Lot: [id number]   NDC: [address]-1

## 2019-07-18 ENCOUNTER — Ambulatory Visit: Payer: Medicare Other

## 2019-07-19 IMAGING — CT CT CHEST LUNG CANCER SCREENING LOW DOSE W/O CM
1 of 3 series · 10 of 30 positions shown, 13 images · non-contrast
Comparison: 07/29/2015 screening chest CT. 03/25/2016 chest
radiograph.

CLINICAL DATA: 65-year-old asymptomatic female current smoker with
35 pack-year smoking history.

EXAM:
CT CHEST WITHOUT CONTRAST LOW-DOSE FOR LUNG CANCER SCREENING
TECHNIQUE: Multidetector CT imaging of the chest was performed following the
standard protocol without IV contrast.

[ct lung segmentation data · axial · 0.67mm/px · z∈[-288,-288]mm · 10 of 286 frames shown]
[frame 1/286  mediastinal]
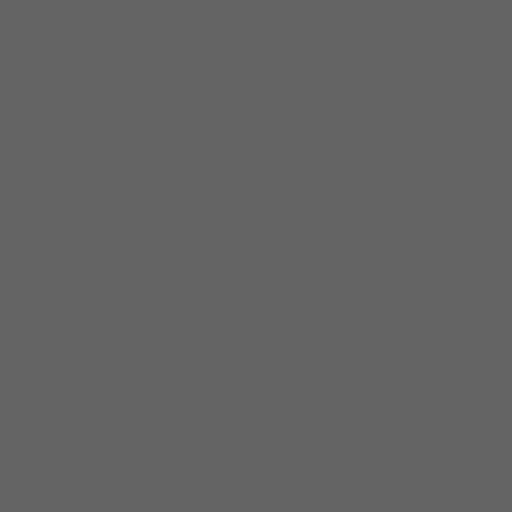
[frame 1/286  lung]
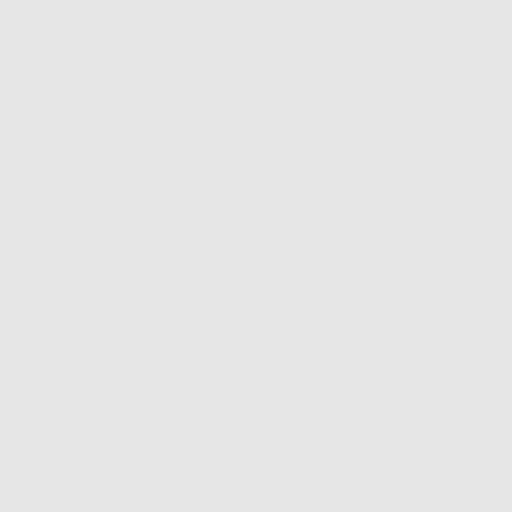
[frame 32/286  lung]
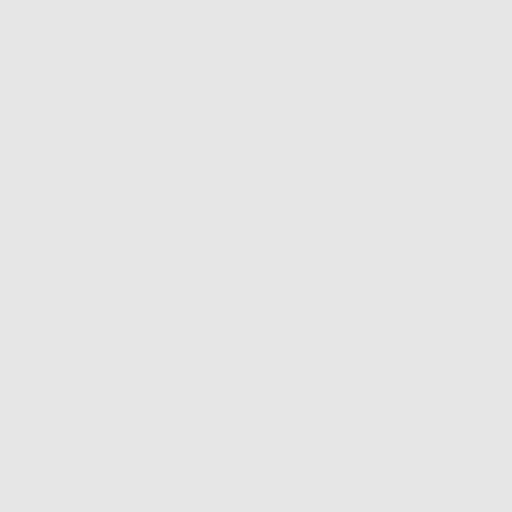
[frame 64/286  lung]
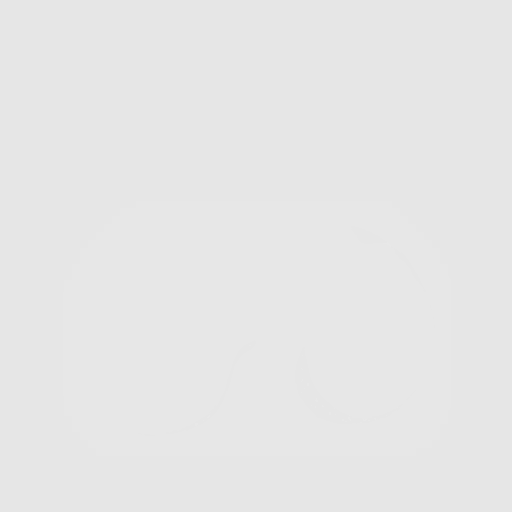
[frame 96/286  lung]
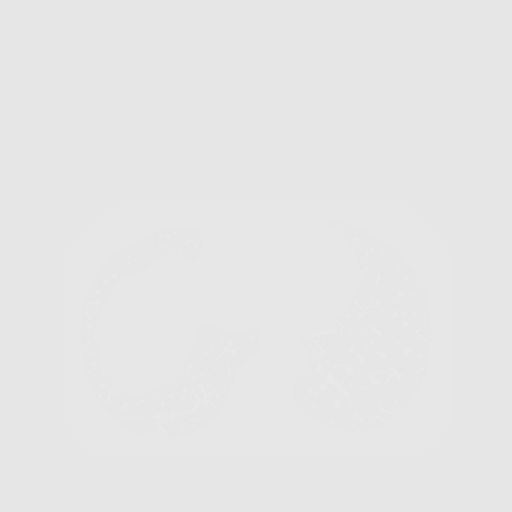
[frame 127/286  mediastinal]
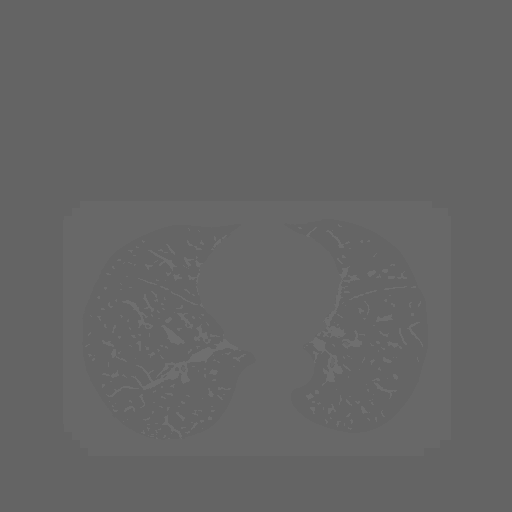
[frame 127/286  lung]
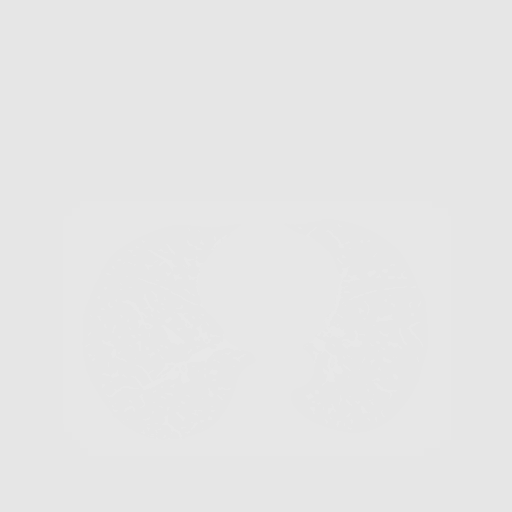
[frame 159/286  lung]
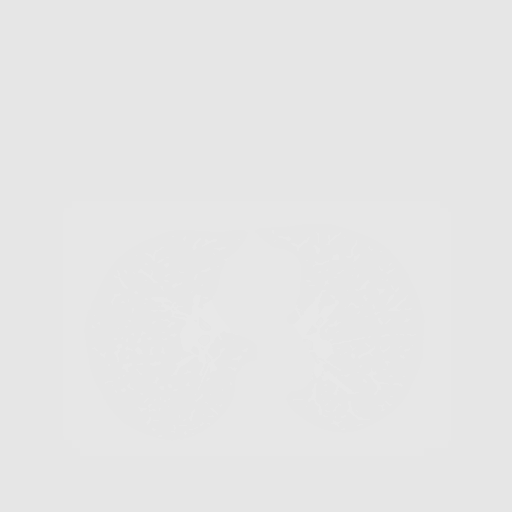
[frame 191/286  lung]
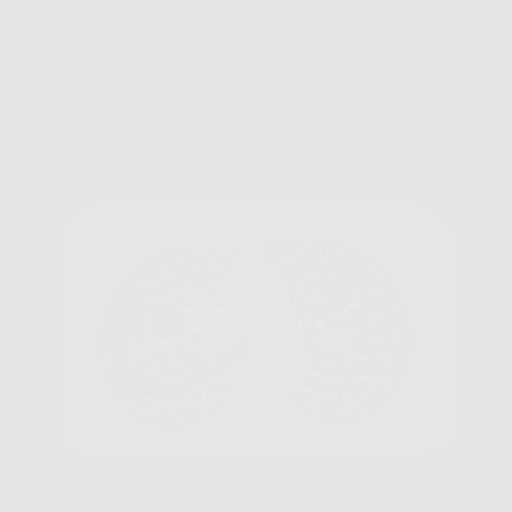
[frame 222/286  lung]
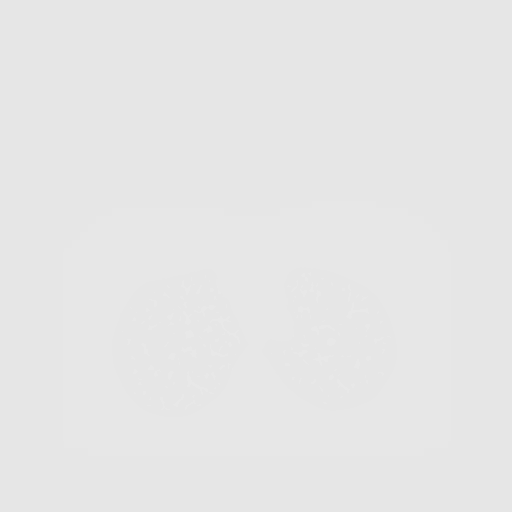
[frame 254/286  mediastinal]
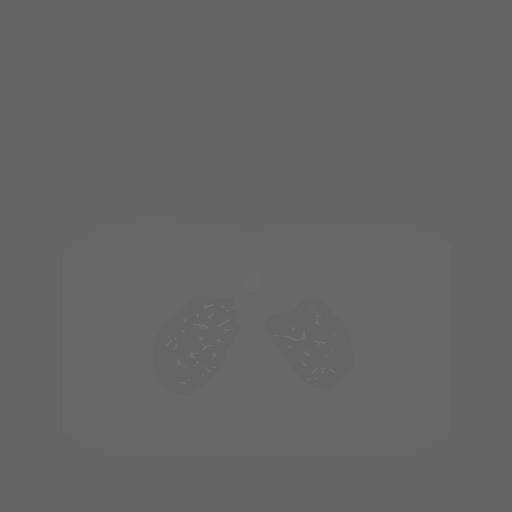
[frame 254/286  lung]
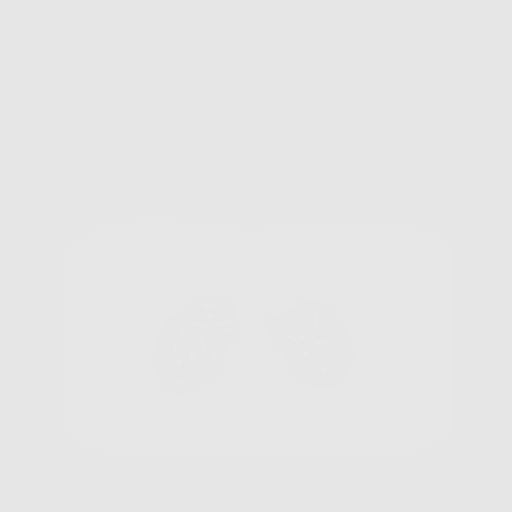
[frame 286/286  lung]
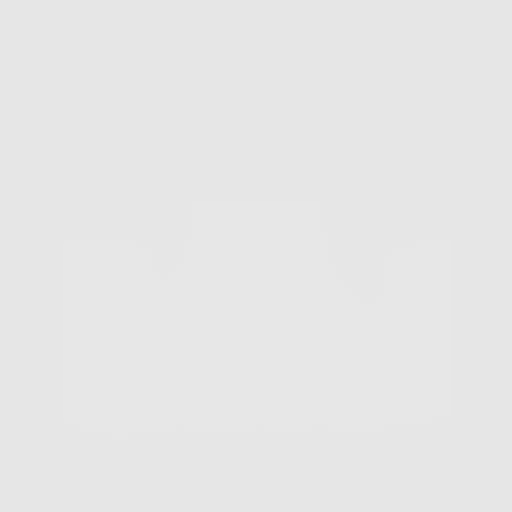

[10 of 30 positions shown; findings below may reference images not displayed]

FINDINGS: Cardiovascular: Normal heart size. No significant pericardial
fluid/thickening. Left anterior descending, left circumflex and
right coronary atherosclerosis. Atherosclerotic nonaneurysmal
thoracic aorta. Aberrant nonaneurysmal right subclavian artery
arising from the distal aortic arch with retroesophageal course.
Normal caliber pulmonary arteries.

Mediastinum/Nodes: No discrete thyroid nodules. Unremarkable
esophagus. No pathologically enlarged axillary, mediastinal or gross
hilar lymph nodes, noting limited sensitivity for the detection of
hilar adenopathy on this noncontrast study.

Lungs/Pleura: No pneumothorax. No pleural effusion. Mild
centrilobular emphysema with mild diffuse bronchial wall thickening.
No acute consolidative airspace disease or lung masses. None of the
previously visualized scattered small pulmonary nodules has
demonstrated significant interval growth. No new significant
pulmonary nodules.

Upper abdomen: Unremarkable.

Musculoskeletal: No aggressive appearing focal osseous lesions.
Minimal thoracic spondylosis.
IMPRESSION: 1. Lung-RADS 2-S, benign appearance or behavior. Continue annual
screening with low-dose chest CT without contrast in 12 months.
2. The "S" modifier above refers to potentially clinically
significant non lung cancer related findings. Specifically,
three-vessel coronary atherosclerosis.
3. Aberrant right subclavian artery.

Aortic Atherosclerosis ([id number]) and Emphysema ([id number].F).

## 2019-10-11 ENCOUNTER — Ambulatory Visit: Payer: Medicare Other | Admitting: Endocrinology

## 2020-03-03 ENCOUNTER — Other Ambulatory Visit: Payer: Self-pay

## 2020-03-03 ENCOUNTER — Ambulatory Visit (INDEPENDENT_AMBULATORY_CARE_PROVIDER_SITE_OTHER)
Admission: RE | Admit: 2020-03-03 | Discharge: 2020-03-03 | Disposition: A | Discharge status: Home / Self Care | Payer: Medicare Other | Source: Ambulatory Visit | Attending: Acute Care | Admitting: Acute Care

## 2020-03-03 DIAGNOSIS — Z87891 Personal history of nicotine dependence: Secondary | ICD-10-CM

## 2020-03-03 DIAGNOSIS — Z122 Encounter for screening for malignant neoplasm of respiratory organs: Secondary | ICD-10-CM

## 2020-03-03 DIAGNOSIS — F17210 Nicotine dependence, cigarettes, uncomplicated: Secondary | ICD-10-CM

## 2020-03-06 NOTE — Progress Notes (Signed)
Please call patient and let them  know their  low dose Ct was read as a Lung RADS 2: nodules that are benign in appearance and behavior with a very low likelihood of becoming a clinically active cancer due to size or lack of growth. Recommendation per radiology is for a repeat LDCT in 12 months. .Please let them  know we will order and schedule their  annual screening scan for 02/2021. Please let them  know there was notation of CAD on their  scan.  Please remind the patient  that this is a non-gated exam therefore degree or severity of disease  cannot be determined. Please have them  follow up with their PCP regarding potential risk factor modification, dietary therapy or pharmacologic therapy if clinically indicated. Pt.  is  currently on statin therapy. Please place order for annual  screening scan for  02/2021 and fax results to PCP. Thanks so much. 

## 2020-03-07 ENCOUNTER — Other Ambulatory Visit: Payer: Self-pay | Admitting: *Deleted

## 2020-03-07 DIAGNOSIS — F17210 Nicotine dependence, cigarettes, uncomplicated: Secondary | ICD-10-CM

## 2020-03-07 DIAGNOSIS — Z87891 Personal history of nicotine dependence: Secondary | ICD-10-CM

## 2020-04-09 ENCOUNTER — Other Ambulatory Visit: Payer: Self-pay | Admitting: Internal Medicine

## 2020-04-10 ENCOUNTER — Other Ambulatory Visit: Payer: Self-pay | Admitting: Internal Medicine

## 2020-04-10 DIAGNOSIS — I739 Peripheral vascular disease, unspecified: Secondary | ICD-10-CM

## 2020-04-21 ENCOUNTER — Other Ambulatory Visit: Payer: Medicare Other

## 2020-04-22 ENCOUNTER — Other Ambulatory Visit: Payer: Self-pay | Admitting: Family Medicine

## 2020-04-24 ENCOUNTER — Ambulatory Visit
Admission: RE | Admit: 2020-04-24 | Discharge: 2020-04-24 | Disposition: A | Discharge status: Home / Self Care | Payer: Medicare Other | Source: Ambulatory Visit | Attending: Internal Medicine | Admitting: Internal Medicine

## 2020-04-24 DIAGNOSIS — I739 Peripheral vascular disease, unspecified: Secondary | ICD-10-CM

## 2020-07-01 DIAGNOSIS — Z681 Body mass index (BMI) 19 or less, adult: Secondary | ICD-10-CM | POA: Diagnosis not present

## 2020-08-18 DIAGNOSIS — E7800 Pure hypercholesterolemia, unspecified: Secondary | ICD-10-CM | POA: Diagnosis not present

## 2020-08-18 DIAGNOSIS — M810 Age-related osteoporosis without current pathological fracture: Secondary | ICD-10-CM | POA: Diagnosis not present

## 2020-08-18 DIAGNOSIS — J439 Emphysema, unspecified: Secondary | ICD-10-CM | POA: Diagnosis not present

## 2020-08-18 DIAGNOSIS — E1151 Type 2 diabetes mellitus with diabetic peripheral angiopathy without gangrene: Secondary | ICD-10-CM | POA: Diagnosis not present

## 2020-08-18 DIAGNOSIS — I10 Essential (primary) hypertension: Secondary | ICD-10-CM | POA: Diagnosis not present

## 2020-08-18 DIAGNOSIS — E039 Hypothyroidism, unspecified: Secondary | ICD-10-CM | POA: Diagnosis not present

## 2020-10-02 DIAGNOSIS — E559 Vitamin D deficiency, unspecified: Secondary | ICD-10-CM | POA: Diagnosis not present

## 2020-10-02 DIAGNOSIS — Z7984 Long term (current) use of oral hypoglycemic drugs: Secondary | ICD-10-CM | POA: Diagnosis not present

## 2020-10-02 DIAGNOSIS — I10 Essential (primary) hypertension: Secondary | ICD-10-CM | POA: Diagnosis not present

## 2020-10-02 DIAGNOSIS — I700 Atherosclerosis of aorta: Secondary | ICD-10-CM | POA: Diagnosis not present

## 2020-10-02 DIAGNOSIS — I739 Peripheral vascular disease, unspecified: Secondary | ICD-10-CM | POA: Diagnosis not present

## 2020-10-02 DIAGNOSIS — Z1389 Encounter for screening for other disorder: Secondary | ICD-10-CM | POA: Diagnosis not present

## 2020-10-02 DIAGNOSIS — E7800 Pure hypercholesterolemia, unspecified: Secondary | ICD-10-CM | POA: Diagnosis not present

## 2020-10-02 DIAGNOSIS — E1151 Type 2 diabetes mellitus with diabetic peripheral angiopathy without gangrene: Secondary | ICD-10-CM | POA: Diagnosis not present

## 2020-10-02 DIAGNOSIS — Z0000 Encounter for general adult medical examination without abnormal findings: Secondary | ICD-10-CM | POA: Diagnosis not present

## 2020-10-02 DIAGNOSIS — M810 Age-related osteoporosis without current pathological fracture: Secondary | ICD-10-CM | POA: Diagnosis not present

## 2020-10-02 DIAGNOSIS — J439 Emphysema, unspecified: Secondary | ICD-10-CM | POA: Diagnosis not present

## 2020-10-02 DIAGNOSIS — E039 Hypothyroidism, unspecified: Secondary | ICD-10-CM | POA: Diagnosis not present

## 2020-11-18 DIAGNOSIS — M810 Age-related osteoporosis without current pathological fracture: Secondary | ICD-10-CM | POA: Diagnosis not present

## 2020-11-18 DIAGNOSIS — J439 Emphysema, unspecified: Secondary | ICD-10-CM | POA: Diagnosis not present

## 2020-11-18 DIAGNOSIS — E1151 Type 2 diabetes mellitus with diabetic peripheral angiopathy without gangrene: Secondary | ICD-10-CM | POA: Diagnosis not present

## 2020-11-18 DIAGNOSIS — I10 Essential (primary) hypertension: Secondary | ICD-10-CM | POA: Diagnosis not present

## 2020-11-18 DIAGNOSIS — E039 Hypothyroidism, unspecified: Secondary | ICD-10-CM | POA: Diagnosis not present

## 2020-11-18 DIAGNOSIS — E7800 Pure hypercholesterolemia, unspecified: Secondary | ICD-10-CM | POA: Diagnosis not present

## 2021-01-08 DIAGNOSIS — E1151 Type 2 diabetes mellitus with diabetic peripheral angiopathy without gangrene: Secondary | ICD-10-CM | POA: Diagnosis not present

## 2021-01-08 DIAGNOSIS — M810 Age-related osteoporosis without current pathological fracture: Secondary | ICD-10-CM | POA: Diagnosis not present

## 2021-01-08 DIAGNOSIS — E7800 Pure hypercholesterolemia, unspecified: Secondary | ICD-10-CM | POA: Diagnosis not present

## 2021-01-08 DIAGNOSIS — J439 Emphysema, unspecified: Secondary | ICD-10-CM | POA: Diagnosis not present

## 2021-01-08 DIAGNOSIS — I10 Essential (primary) hypertension: Secondary | ICD-10-CM | POA: Diagnosis not present

## 2021-01-08 DIAGNOSIS — E039 Hypothyroidism, unspecified: Secondary | ICD-10-CM | POA: Diagnosis not present

## 2021-01-23 DIAGNOSIS — Z1231 Encounter for screening mammogram for malignant neoplasm of breast: Secondary | ICD-10-CM | POA: Diagnosis not present

## 2021-03-05 ENCOUNTER — Other Ambulatory Visit: Payer: Self-pay

## 2021-03-05 ENCOUNTER — Ambulatory Visit (INDEPENDENT_AMBULATORY_CARE_PROVIDER_SITE_OTHER)
Admission: RE | Admit: 2021-03-05 | Discharge: 2021-03-05 | Disposition: A | Discharge status: Home / Self Care | Payer: Medicare Other | Source: Ambulatory Visit | Attending: Acute Care | Admitting: Acute Care

## 2021-03-05 DIAGNOSIS — F17210 Nicotine dependence, cigarettes, uncomplicated: Secondary | ICD-10-CM | POA: Diagnosis not present

## 2021-03-05 DIAGNOSIS — Z87891 Personal history of nicotine dependence: Secondary | ICD-10-CM | POA: Diagnosis not present

## 2021-03-18 ENCOUNTER — Other Ambulatory Visit: Payer: Self-pay | Admitting: Acute Care

## 2021-03-18 DIAGNOSIS — F17210 Nicotine dependence, cigarettes, uncomplicated: Secondary | ICD-10-CM

## 2021-03-30 DIAGNOSIS — Z7984 Long term (current) use of oral hypoglycemic drugs: Secondary | ICD-10-CM | POA: Diagnosis not present

## 2021-03-30 DIAGNOSIS — J439 Emphysema, unspecified: Secondary | ICD-10-CM | POA: Diagnosis not present

## 2021-03-30 DIAGNOSIS — E1151 Type 2 diabetes mellitus with diabetic peripheral angiopathy without gangrene: Secondary | ICD-10-CM | POA: Diagnosis not present

## 2021-03-30 DIAGNOSIS — I2510 Atherosclerotic heart disease of native coronary artery without angina pectoris: Secondary | ICD-10-CM | POA: Diagnosis not present

## 2021-03-30 DIAGNOSIS — E039 Hypothyroidism, unspecified: Secondary | ICD-10-CM | POA: Diagnosis not present

## 2021-03-30 DIAGNOSIS — I700 Atherosclerosis of aorta: Secondary | ICD-10-CM | POA: Diagnosis not present

## 2021-03-30 DIAGNOSIS — E7800 Pure hypercholesterolemia, unspecified: Secondary | ICD-10-CM | POA: Diagnosis not present

## 2021-03-30 DIAGNOSIS — I10 Essential (primary) hypertension: Secondary | ICD-10-CM | POA: Diagnosis not present

## 2021-03-30 DIAGNOSIS — F17210 Nicotine dependence, cigarettes, uncomplicated: Secondary | ICD-10-CM | POA: Diagnosis not present

## 2021-03-30 DIAGNOSIS — Z23 Encounter for immunization: Secondary | ICD-10-CM | POA: Diagnosis not present

## 2021-03-30 DIAGNOSIS — M810 Age-related osteoporosis without current pathological fracture: Secondary | ICD-10-CM | POA: Diagnosis not present

## 2021-04-22 DIAGNOSIS — E119 Type 2 diabetes mellitus without complications: Secondary | ICD-10-CM | POA: Diagnosis not present

## 2021-04-22 DIAGNOSIS — H25813 Combined forms of age-related cataract, bilateral: Secondary | ICD-10-CM | POA: Diagnosis not present

## 2021-04-22 DIAGNOSIS — H5203 Hypermetropia, bilateral: Secondary | ICD-10-CM | POA: Diagnosis not present

## 2021-10-06 DIAGNOSIS — I10 Essential (primary) hypertension: Secondary | ICD-10-CM | POA: Diagnosis not present

## 2021-10-06 DIAGNOSIS — I739 Peripheral vascular disease, unspecified: Secondary | ICD-10-CM | POA: Diagnosis not present

## 2021-10-06 DIAGNOSIS — E1151 Type 2 diabetes mellitus with diabetic peripheral angiopathy without gangrene: Secondary | ICD-10-CM | POA: Diagnosis not present

## 2021-10-06 DIAGNOSIS — Z23 Encounter for immunization: Secondary | ICD-10-CM | POA: Diagnosis not present

## 2021-10-06 DIAGNOSIS — E7800 Pure hypercholesterolemia, unspecified: Secondary | ICD-10-CM | POA: Diagnosis not present

## 2021-10-06 DIAGNOSIS — Z0000 Encounter for general adult medical examination without abnormal findings: Secondary | ICD-10-CM | POA: Diagnosis not present

## 2021-10-06 DIAGNOSIS — E039 Hypothyroidism, unspecified: Secondary | ICD-10-CM | POA: Diagnosis not present

## 2021-10-06 DIAGNOSIS — M810 Age-related osteoporosis without current pathological fracture: Secondary | ICD-10-CM | POA: Diagnosis not present

## 2021-10-06 DIAGNOSIS — I2510 Atherosclerotic heart disease of native coronary artery without angina pectoris: Secondary | ICD-10-CM | POA: Diagnosis not present

## 2021-10-06 DIAGNOSIS — I700 Atherosclerosis of aorta: Secondary | ICD-10-CM | POA: Diagnosis not present

## 2022-02-03 DIAGNOSIS — Z1231 Encounter for screening mammogram for malignant neoplasm of breast: Secondary | ICD-10-CM | POA: Diagnosis not present

## 2022-02-03 DIAGNOSIS — M8589 Other specified disorders of bone density and structure, multiple sites: Secondary | ICD-10-CM | POA: Diagnosis not present

## 2022-03-05 ENCOUNTER — Ambulatory Visit
Admission: RE | Admit: 2022-03-05 | Discharge: 2022-03-05 | Disposition: A | Discharge status: Home / Self Care | Payer: Medicare Other | Source: Ambulatory Visit | Attending: Internal Medicine | Admitting: Internal Medicine

## 2022-03-05 DIAGNOSIS — F17210 Nicotine dependence, cigarettes, uncomplicated: Secondary | ICD-10-CM

## 2022-03-10 ENCOUNTER — Other Ambulatory Visit: Payer: Self-pay

## 2022-03-10 DIAGNOSIS — F17210 Nicotine dependence, cigarettes, uncomplicated: Secondary | ICD-10-CM

## 2022-03-10 DIAGNOSIS — Z122 Encounter for screening for malignant neoplasm of respiratory organs: Secondary | ICD-10-CM

## 2022-03-10 DIAGNOSIS — Z87891 Personal history of nicotine dependence: Secondary | ICD-10-CM

## 2022-03-24 DIAGNOSIS — I739 Peripheral vascular disease, unspecified: Secondary | ICD-10-CM | POA: Diagnosis not present

## 2022-03-24 DIAGNOSIS — E039 Hypothyroidism, unspecified: Secondary | ICD-10-CM | POA: Diagnosis not present

## 2022-03-24 DIAGNOSIS — J439 Emphysema, unspecified: Secondary | ICD-10-CM | POA: Diagnosis not present

## 2022-03-24 DIAGNOSIS — I10 Essential (primary) hypertension: Secondary | ICD-10-CM | POA: Diagnosis not present

## 2022-03-24 DIAGNOSIS — E7800 Pure hypercholesterolemia, unspecified: Secondary | ICD-10-CM | POA: Diagnosis not present

## 2022-03-24 DIAGNOSIS — M810 Age-related osteoporosis without current pathological fracture: Secondary | ICD-10-CM | POA: Diagnosis not present

## 2022-03-24 DIAGNOSIS — I2510 Atherosclerotic heart disease of native coronary artery without angina pectoris: Secondary | ICD-10-CM | POA: Diagnosis not present

## 2022-03-24 DIAGNOSIS — I700 Atherosclerosis of aorta: Secondary | ICD-10-CM | POA: Diagnosis not present

## 2022-03-24 DIAGNOSIS — F17210 Nicotine dependence, cigarettes, uncomplicated: Secondary | ICD-10-CM | POA: Diagnosis not present

## 2022-03-24 DIAGNOSIS — E1169 Type 2 diabetes mellitus with other specified complication: Secondary | ICD-10-CM | POA: Diagnosis not present

## 2022-04-23 DIAGNOSIS — H25813 Combined forms of age-related cataract, bilateral: Secondary | ICD-10-CM | POA: Diagnosis not present

## 2022-04-23 DIAGNOSIS — E119 Type 2 diabetes mellitus without complications: Secondary | ICD-10-CM | POA: Diagnosis not present

## 2022-04-23 DIAGNOSIS — H52203 Unspecified astigmatism, bilateral: Secondary | ICD-10-CM | POA: Diagnosis not present

## 2022-04-23 DIAGNOSIS — H5203 Hypermetropia, bilateral: Secondary | ICD-10-CM | POA: Diagnosis not present

## 2022-08-03 ENCOUNTER — Other Ambulatory Visit: Payer: Self-pay | Admitting: Obstetrics and Gynecology

## 2022-08-03 DIAGNOSIS — R102 Pelvic and perineal pain: Secondary | ICD-10-CM

## 2022-08-27 ENCOUNTER — Ambulatory Visit
Admission: RE | Admit: 2022-08-27 | Discharge: 2022-08-27 | Disposition: A | Discharge status: Home / Self Care | Payer: Medicare Other | Source: Ambulatory Visit | Attending: Obstetrics and Gynecology | Admitting: Obstetrics and Gynecology

## 2022-08-27 DIAGNOSIS — R102 Pelvic and perineal pain: Secondary | ICD-10-CM

## 2022-08-31 DIAGNOSIS — B3731 Acute candidiasis of vulva and vagina: Secondary | ICD-10-CM | POA: Diagnosis not present

## 2022-10-05 DIAGNOSIS — E1151 Type 2 diabetes mellitus with diabetic peripheral angiopathy without gangrene: Secondary | ICD-10-CM | POA: Diagnosis not present

## 2022-10-05 DIAGNOSIS — J439 Emphysema, unspecified: Secondary | ICD-10-CM | POA: Diagnosis not present

## 2022-10-05 DIAGNOSIS — Z0000 Encounter for general adult medical examination without abnormal findings: Secondary | ICD-10-CM | POA: Diagnosis not present

## 2022-10-05 DIAGNOSIS — E039 Hypothyroidism, unspecified: Secondary | ICD-10-CM | POA: Diagnosis not present

## 2022-10-05 DIAGNOSIS — I700 Atherosclerosis of aorta: Secondary | ICD-10-CM | POA: Diagnosis not present

## 2022-10-05 DIAGNOSIS — E1165 Type 2 diabetes mellitus with hyperglycemia: Secondary | ICD-10-CM | POA: Diagnosis not present

## 2022-10-05 DIAGNOSIS — E7800 Pure hypercholesterolemia, unspecified: Secondary | ICD-10-CM | POA: Diagnosis not present

## 2022-10-05 DIAGNOSIS — F17210 Nicotine dependence, cigarettes, uncomplicated: Secondary | ICD-10-CM | POA: Diagnosis not present

## 2022-10-05 DIAGNOSIS — I10 Essential (primary) hypertension: Secondary | ICD-10-CM | POA: Diagnosis not present

## 2022-11-23 DIAGNOSIS — M810 Age-related osteoporosis without current pathological fracture: Secondary | ICD-10-CM | POA: Diagnosis not present

## 2022-11-23 DIAGNOSIS — E039 Hypothyroidism, unspecified: Secondary | ICD-10-CM | POA: Diagnosis not present

## 2022-11-23 DIAGNOSIS — E7800 Pure hypercholesterolemia, unspecified: Secondary | ICD-10-CM | POA: Diagnosis not present

## 2022-11-23 DIAGNOSIS — R109 Unspecified abdominal pain: Secondary | ICD-10-CM | POA: Diagnosis not present

## 2022-11-23 DIAGNOSIS — Z0000 Encounter for general adult medical examination without abnormal findings: Secondary | ICD-10-CM | POA: Diagnosis not present

## 2022-11-23 DIAGNOSIS — I700 Atherosclerosis of aorta: Secondary | ICD-10-CM | POA: Diagnosis not present

## 2022-11-23 DIAGNOSIS — E1165 Type 2 diabetes mellitus with hyperglycemia: Secondary | ICD-10-CM | POA: Diagnosis not present

## 2022-11-23 DIAGNOSIS — E1151 Type 2 diabetes mellitus with diabetic peripheral angiopathy without gangrene: Secondary | ICD-10-CM | POA: Diagnosis not present

## 2022-11-23 DIAGNOSIS — I2510 Atherosclerotic heart disease of native coronary artery without angina pectoris: Secondary | ICD-10-CM | POA: Diagnosis not present

## 2023-02-09 DIAGNOSIS — Z1231 Encounter for screening mammogram for malignant neoplasm of breast: Secondary | ICD-10-CM | POA: Diagnosis not present

## 2023-03-09 ENCOUNTER — Inpatient Hospital Stay
Admission: RE | Admit: 2023-03-09 | Discharge: 2023-03-09 | Disposition: A | Discharge status: Home / Self Care | Payer: Medicare Other | Source: Ambulatory Visit | Attending: Internal Medicine | Admitting: Internal Medicine

## 2023-03-09 DIAGNOSIS — Z122 Encounter for screening for malignant neoplasm of respiratory organs: Secondary | ICD-10-CM

## 2023-03-09 DIAGNOSIS — F17210 Nicotine dependence, cigarettes, uncomplicated: Secondary | ICD-10-CM

## 2023-03-09 DIAGNOSIS — Z87891 Personal history of nicotine dependence: Secondary | ICD-10-CM

## 2023-04-06 DIAGNOSIS — E039 Hypothyroidism, unspecified: Secondary | ICD-10-CM | POA: Diagnosis not present

## 2023-04-06 DIAGNOSIS — E1165 Type 2 diabetes mellitus with hyperglycemia: Secondary | ICD-10-CM | POA: Diagnosis not present

## 2023-04-06 DIAGNOSIS — E1151 Type 2 diabetes mellitus with diabetic peripheral angiopathy without gangrene: Secondary | ICD-10-CM | POA: Diagnosis not present

## 2023-04-06 DIAGNOSIS — I700 Atherosclerosis of aorta: Secondary | ICD-10-CM | POA: Diagnosis not present

## 2023-04-06 DIAGNOSIS — E7800 Pure hypercholesterolemia, unspecified: Secondary | ICD-10-CM | POA: Diagnosis not present

## 2023-04-06 DIAGNOSIS — F17210 Nicotine dependence, cigarettes, uncomplicated: Secondary | ICD-10-CM | POA: Diagnosis not present

## 2023-04-06 DIAGNOSIS — Z5181 Encounter for therapeutic drug level monitoring: Secondary | ICD-10-CM | POA: Diagnosis not present

## 2023-04-06 DIAGNOSIS — I2510 Atherosclerotic heart disease of native coronary artery without angina pectoris: Secondary | ICD-10-CM | POA: Diagnosis not present

## 2023-04-06 DIAGNOSIS — M8588 Other specified disorders of bone density and structure, other site: Secondary | ICD-10-CM | POA: Diagnosis not present

## 2023-04-06 DIAGNOSIS — I10 Essential (primary) hypertension: Secondary | ICD-10-CM | POA: Diagnosis not present

## 2023-04-11 ENCOUNTER — Other Ambulatory Visit: Payer: Self-pay

## 2023-04-11 DIAGNOSIS — Z87891 Personal history of nicotine dependence: Secondary | ICD-10-CM

## 2023-04-11 DIAGNOSIS — Z122 Encounter for screening for malignant neoplasm of respiratory organs: Secondary | ICD-10-CM

## 2023-04-11 DIAGNOSIS — F17210 Nicotine dependence, cigarettes, uncomplicated: Secondary | ICD-10-CM

## 2023-04-25 DIAGNOSIS — H5203 Hypermetropia, bilateral: Secondary | ICD-10-CM | POA: Diagnosis not present

## 2023-04-25 DIAGNOSIS — H52203 Unspecified astigmatism, bilateral: Secondary | ICD-10-CM | POA: Diagnosis not present

## 2023-04-25 DIAGNOSIS — E119 Type 2 diabetes mellitus without complications: Secondary | ICD-10-CM | POA: Diagnosis not present

## 2023-04-25 DIAGNOSIS — H25813 Combined forms of age-related cataract, bilateral: Secondary | ICD-10-CM | POA: Diagnosis not present

## 2023-08-09 DIAGNOSIS — M8580 Other specified disorders of bone density and structure, unspecified site: Secondary | ICD-10-CM | POA: Diagnosis not present

## 2023-11-30 ENCOUNTER — Encounter (HOSPITAL_BASED_OUTPATIENT_CLINIC_OR_DEPARTMENT_OTHER): Payer: Self-pay | Admitting: Internal Medicine

## 2023-11-30 ENCOUNTER — Other Ambulatory Visit (HOSPITAL_BASED_OUTPATIENT_CLINIC_OR_DEPARTMENT_OTHER): Payer: Self-pay | Admitting: Internal Medicine

## 2023-11-30 DIAGNOSIS — Z5181 Encounter for therapeutic drug level monitoring: Secondary | ICD-10-CM | POA: Diagnosis not present

## 2023-11-30 DIAGNOSIS — E1165 Type 2 diabetes mellitus with hyperglycemia: Secondary | ICD-10-CM | POA: Diagnosis not present

## 2023-11-30 DIAGNOSIS — E1151 Type 2 diabetes mellitus with diabetic peripheral angiopathy without gangrene: Secondary | ICD-10-CM | POA: Diagnosis not present

## 2023-11-30 DIAGNOSIS — I10 Essential (primary) hypertension: Secondary | ICD-10-CM | POA: Diagnosis not present

## 2023-11-30 DIAGNOSIS — I700 Atherosclerosis of aorta: Secondary | ICD-10-CM | POA: Diagnosis not present

## 2023-11-30 DIAGNOSIS — Z780 Asymptomatic menopausal state: Secondary | ICD-10-CM

## 2023-11-30 DIAGNOSIS — E7800 Pure hypercholesterolemia, unspecified: Secondary | ICD-10-CM | POA: Diagnosis not present

## 2023-11-30 DIAGNOSIS — I739 Peripheral vascular disease, unspecified: Secondary | ICD-10-CM | POA: Diagnosis not present

## 2023-11-30 DIAGNOSIS — E039 Hypothyroidism, unspecified: Secondary | ICD-10-CM | POA: Diagnosis not present

## 2023-11-30 DIAGNOSIS — Z0000 Encounter for general adult medical examination without abnormal findings: Secondary | ICD-10-CM | POA: Diagnosis not present

## 2023-11-30 DIAGNOSIS — M810 Age-related osteoporosis without current pathological fracture: Secondary | ICD-10-CM | POA: Diagnosis not present

## 2023-11-30 DIAGNOSIS — I2510 Atherosclerotic heart disease of native coronary artery without angina pectoris: Secondary | ICD-10-CM | POA: Diagnosis not present

## 2024-02-15 DIAGNOSIS — M8589 Other specified disorders of bone density and structure, multiple sites: Secondary | ICD-10-CM | POA: Diagnosis not present

## 2024-02-15 DIAGNOSIS — Z1231 Encounter for screening mammogram for malignant neoplasm of breast: Secondary | ICD-10-CM | POA: Diagnosis not present

## 2024-02-20 DIAGNOSIS — E1165 Type 2 diabetes mellitus with hyperglycemia: Secondary | ICD-10-CM | POA: Diagnosis not present

## 2024-03-12 ENCOUNTER — Ambulatory Visit
Admission: RE | Admit: 2024-03-12 | Discharge: 2024-03-12 | Disposition: A | Discharge status: Home / Self Care | Source: Ambulatory Visit | Attending: Acute Care | Admitting: Acute Care

## 2024-03-12 DIAGNOSIS — Z87891 Personal history of nicotine dependence: Secondary | ICD-10-CM

## 2024-03-12 DIAGNOSIS — Z122 Encounter for screening for malignant neoplasm of respiratory organs: Secondary | ICD-10-CM

## 2024-03-12 DIAGNOSIS — F17210 Nicotine dependence, cigarettes, uncomplicated: Secondary | ICD-10-CM

## 2024-03-19 ENCOUNTER — Other Ambulatory Visit: Payer: Self-pay

## 2024-03-19 DIAGNOSIS — F17210 Nicotine dependence, cigarettes, uncomplicated: Secondary | ICD-10-CM

## 2024-03-19 DIAGNOSIS — Z87891 Personal history of nicotine dependence: Secondary | ICD-10-CM

## 2024-03-19 DIAGNOSIS — Z122 Encounter for screening for malignant neoplasm of respiratory organs: Secondary | ICD-10-CM
